# Patient Record
Sex: Female | Born: 1990 | Race: Black or African American | Hispanic: No | Smoking: Former smoker
Health system: Southern US, Community
[De-identification: ages and names within clinical notes are randomized; demographics above are authoritative.]

## PROBLEM LIST (undated history)

## (undated) ENCOUNTER — Inpatient Hospital Stay (HOSPITAL_COMMUNITY): Payer: Self-pay

## (undated) DIAGNOSIS — L0291 Cutaneous abscess, unspecified: Secondary | ICD-10-CM

---
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
## 2008-12-02 ENCOUNTER — Emergency Department (HOSPITAL_COMMUNITY): Admission: EM | Admit: 2008-12-02 | Discharge: 2008-12-02 | Payer: Self-pay | Admitting: Emergency Medicine

## 2009-05-25 ENCOUNTER — Ambulatory Visit (HOSPITAL_COMMUNITY): Admission: RE | Admit: 2009-05-25 | Discharge: 2009-05-25 | Payer: Self-pay | Admitting: Obstetrics

## 2009-09-03 ENCOUNTER — Inpatient Hospital Stay (HOSPITAL_COMMUNITY): Admission: AD | Admit: 2009-09-03 | Discharge: 2009-09-06 | Payer: Self-pay | Admitting: Obstetrics

## 2009-11-29 ENCOUNTER — Emergency Department (HOSPITAL_COMMUNITY): Admission: EM | Admit: 2009-11-29 | Discharge: 2009-11-29 | Payer: Self-pay | Admitting: Family Medicine

## 2010-06-08 LAB — WET PREP, GENITAL
Clue Cells Wet Prep HPF POC: NONE SEEN
Trich, Wet Prep: NONE SEEN
Yeast Wet Prep HPF POC: NONE SEEN

## 2010-06-08 LAB — POCT URINALYSIS DIPSTICK
Nitrite: NEGATIVE
Specific Gravity, Urine: 1.02 (ref 1.005–1.030)
Urobilinogen, UA: 4 mg/dL — ABNORMAL HIGH (ref 0.0–1.0)
pH: 7 (ref 5.0–8.0)

## 2010-06-08 LAB — GC/CHLAMYDIA PROBE AMP, GENITAL: GC Probe Amp, Genital: NEGATIVE

## 2010-06-12 LAB — CBC
HCT: 27.7 % — ABNORMAL LOW (ref 36.0–46.0)
HCT: 31.3 % — ABNORMAL LOW (ref 36.0–46.0)
Hemoglobin: 10.6 g/dL — ABNORMAL LOW (ref 12.0–15.0)
MCHC: 33.7 g/dL (ref 30.0–36.0)
MCHC: 34.1 g/dL (ref 30.0–36.0)
MCV: 89.7 fL (ref 78.0–100.0)
Platelets: 124 10*3/uL — ABNORMAL LOW (ref 150–400)
Platelets: 155 10*3/uL (ref 150–400)
RBC: 3.49 MIL/uL — ABNORMAL LOW (ref 3.87–5.11)

## 2010-06-30 LAB — CULTURE, ROUTINE-ABSCESS

## 2010-07-15 ENCOUNTER — Inpatient Hospital Stay (INDEPENDENT_AMBULATORY_CARE_PROVIDER_SITE_OTHER)
Admission: RE | Admit: 2010-07-15 | Discharge: 2010-07-15 | Disposition: A | Discharge status: Home / Self Care | Payer: Self-pay | Source: Ambulatory Visit | Attending: Family Medicine | Admitting: Family Medicine

## 2010-07-15 DIAGNOSIS — N760 Acute vaginitis: Secondary | ICD-10-CM

## 2010-07-15 LAB — POCT URINALYSIS DIP (DEVICE)
Ketones, ur: NEGATIVE mg/dL
Nitrite: NEGATIVE
Urobilinogen, UA: 1 mg/dL (ref 0.0–1.0)
pH: 6.5 (ref 5.0–8.0)

## 2010-08-22 ENCOUNTER — Emergency Department (HOSPITAL_COMMUNITY)
Admission: EM | Admit: 2010-08-22 | Discharge: 2010-08-22 | Disposition: A | Discharge status: Home / Self Care | Payer: Medicaid Other | Attending: Emergency Medicine | Admitting: Emergency Medicine

## 2010-08-22 DIAGNOSIS — B86 Scabies: Secondary | ICD-10-CM | POA: Insufficient documentation

## 2010-08-22 DIAGNOSIS — R21 Rash and other nonspecific skin eruption: Secondary | ICD-10-CM | POA: Insufficient documentation

## 2010-08-22 DIAGNOSIS — L2989 Other pruritus: Secondary | ICD-10-CM | POA: Insufficient documentation

## 2010-08-22 DIAGNOSIS — L298 Other pruritus: Secondary | ICD-10-CM | POA: Insufficient documentation

## 2010-11-08 ENCOUNTER — Emergency Department (HOSPITAL_COMMUNITY)
Admission: EM | Admit: 2010-11-08 | Discharge: 2010-11-08 | Disposition: A | Discharge status: Home / Self Care | Payer: Medicaid Other | Attending: Emergency Medicine | Admitting: Emergency Medicine

## 2010-11-08 DIAGNOSIS — F17200 Nicotine dependence, unspecified, uncomplicated: Secondary | ICD-10-CM | POA: Insufficient documentation

## 2010-11-08 DIAGNOSIS — R5383 Other fatigue: Secondary | ICD-10-CM | POA: Insufficient documentation

## 2010-11-08 DIAGNOSIS — R071 Chest pain on breathing: Secondary | ICD-10-CM | POA: Insufficient documentation

## 2010-11-08 DIAGNOSIS — R0602 Shortness of breath: Secondary | ICD-10-CM | POA: Insufficient documentation

## 2010-11-08 DIAGNOSIS — R5381 Other malaise: Secondary | ICD-10-CM | POA: Insufficient documentation

## 2010-11-08 LAB — COMPREHENSIVE METABOLIC PANEL
ALT: 7 U/L (ref 0–35)
Alkaline Phosphatase: 48 U/L (ref 39–117)
BUN: 6 mg/dL (ref 6–23)
Chloride: 107 mEq/L (ref 96–112)
GFR calc Af Amer: >60 mL/min (ref >60)
Glucose, Bld: 107 mg/dL — ABNORMAL HIGH (ref 70–99)
Potassium: 3.5 mEq/L (ref 3.5–5.1)
Sodium: 139 mEq/L (ref 135–145)

## 2010-11-08 LAB — CBC
HCT: 36 % (ref 36.0–46.0)
Hemoglobin: 12.1 g/dL (ref 12.0–15.0)
MCV: 87.2 fL (ref 78.0–100.0)
RBC: 4.13 MIL/uL (ref 3.87–5.11)
WBC: 6 10*3/uL (ref 4.0–10.5)

## 2010-11-08 LAB — URINALYSIS, ROUTINE W REFLEX MICROSCOPIC
Bilirubin Urine: NEGATIVE
Glucose, UA: NEGATIVE mg/dL
Hgb urine dipstick: NEGATIVE
Nitrite: NEGATIVE
pH: 6.5 (ref 5.0–8.0)

## 2010-11-08 LAB — DIFFERENTIAL
Basophils Absolute: 0 10*3/uL (ref 0.0–0.1)
Basophils Relative: 0 % (ref 0–1)
Eosinophils Relative: 1 % (ref 0–5)
Lymphocytes Relative: 59 % — ABNORMAL HIGH (ref 12–46)
Monocytes Absolute: 0.5 10*3/uL (ref 0.1–1.0)

## 2010-11-08 LAB — POCT PREGNANCY, URINE: Preg Test, Ur: NEGATIVE

## 2010-12-18 LAB — WOUND CULTURE: Culture: NO GROWTH

## 2010-12-21 LAB — URINALYSIS, ROUTINE W REFLEX MICROSCOPIC
Glucose, UA: NEGATIVE
Specific Gravity, Urine: 1.01
Urobilinogen, UA: 0.2
pH: 7

## 2010-12-21 LAB — CBC
MCHC: 33.8
RBC: 3.37 — ABNORMAL LOW
RDW: 13.6
WBC: 11.7 — ABNORMAL HIGH

## 2010-12-22 LAB — CBC
MCHC: 33.9
MCV: 89.1
Platelets: 123 — ABNORMAL LOW
RBC: 2.95 — ABNORMAL LOW
RDW: 12.9

## 2011-04-03 ENCOUNTER — Encounter: Payer: Self-pay | Admitting: Emergency Medicine

## 2011-04-03 ENCOUNTER — Emergency Department (HOSPITAL_COMMUNITY)
Admission: EM | Admit: 2011-04-03 | Discharge: 2011-04-04 | Disposition: A | Discharge status: Home / Self Care | Payer: Medicaid Other | Attending: Emergency Medicine | Admitting: Emergency Medicine

## 2011-04-03 DIAGNOSIS — F17200 Nicotine dependence, unspecified, uncomplicated: Secondary | ICD-10-CM | POA: Insufficient documentation

## 2011-04-03 DIAGNOSIS — L02419 Cutaneous abscess of limb, unspecified: Secondary | ICD-10-CM | POA: Insufficient documentation

## 2011-04-03 DIAGNOSIS — M79609 Pain in unspecified limb: Secondary | ICD-10-CM | POA: Insufficient documentation

## 2011-04-03 NOTE — ED Notes (Signed)
PT. REPORTS PROGRESSING ABSCESS AT RIGHT BUTTOCKS WITH NO DRAINAGE FOR 3 DAYS .

## 2011-04-04 MED ORDER — LIDOCAINE-EPINEPHRINE 1 %-1:100000 IJ SOLN
INTRAMUSCULAR | Status: AC
  Filled 2011-04-04: qty 1

## 2011-04-04 MED ORDER — DOXYCYCLINE HYCLATE 100 MG PO CAPS: 100.0000 mg | ORAL_CAPSULE | Freq: Two times a day (BID) | ORAL | Status: AC

## 2011-04-04 MED ORDER — OXYCODONE-ACETAMINOPHEN 5-325 MG PO TABS
ORAL_TABLET | ORAL | Status: AC
  Filled 2011-04-04: qty 1

## 2011-04-04 NOTE — ED Provider Notes (Signed)
History     CSN: [id number]  Arrival date & time 04/03/11  2021   First MD Initiated Contact with Patient 04/03/11 2356      Chief Complaint  Patient presents with  . Abscess    (Consider location/radiation/quality/duration/timing/severity/associated sxs/prior treatment) Patient is a 21 y.o. female presenting with abscess.  Abscess    the patient reports development of abscess to her bilateral thighs developing over the past several days.  Her right is.  In her left.  She denies fevers or chills.  She denies spreading erythema.  She reports a prior history of abscesses.  She has no inciting injury.  Her symptoms are worsened by movement and palpation and sitting.  Her symptoms are improved by nothing.  Her symptoms are constant.  Her pain is moderate in severity  History reviewed. No pertinent past medical history.  History reviewed. No pertinent past surgical history.  No family history on file.  History  Substance Use Topics  . Smoking status: Current Everyday Smoker  . Smokeless tobacco: Not on file  . Alcohol Use: Yes    OB History    Grav Para Term Preterm Abortions TAB SAB Ect Mult Living                  Review of Systems  All other systems reviewed and are negative.    Allergies  Latex  Home Medications   Current Outpatient Rx  Name Route Sig Dispense Refill  . DOXYCYCLINE HYCLATE 100 MG PO CAPS Oral Take 1 capsule (100 mg total) by mouth 2 (two) times daily. 14 capsule 0    BP 110/61  Pulse 76  Temp(Src) 98.3 F (36.8 C) (Oral)  Resp 16  SpO2 100%  Physical Exam  Nursing note and vitals reviewed. Constitutional: She is oriented to person, place, and time. She appears well-developed and well-nourished. No distress.  HENT:  Head: Normocephalic and atraumatic.  Eyes: EOM are normal.  Neck: Normal range of motion.  Cardiovascular: Normal rate, regular rhythm and normal heart sounds.   Pulmonary/Chest: Effort normal and breath sounds normal.    Abdominal: Soft. She exhibits no distension. There is no tenderness.  Musculoskeletal: Normal range of motion.       Right thigh with approximately 3 cm abscess of her proximal lateral thigh.  There is fluctuance without surrounding erythema or evidence of cellulitis.  No drainage is present.  She also has a small approximately 1 cm abscess to her left proximal lateral thigh without secondary signs of infection  Neurological: She is alert and oriented to person, place, and time.  Skin: Skin is warm and dry.  Psychiatric: She has a normal mood and affect. Judgment normal.    ED Course  Procedures (including critical care time)  INCISION AND DRAINAGE Performed by: Lyanne Co Consent: Verbal consent obtained. Risks and benefits: risks, benefits and alternatives were discussed Time out performed prior to procedure Type: abscess Body area: Right lateral thigh Anesthesia: local infiltration Local anesthetic: lidocaine 2 % with epinephrine Anesthetic total: 4 ml Complexity: complex Blunt dissection to break up loculations Drainage: purulent Drainage amount: Moderate  Packing material: 1/4 in iodoform gauze Patient tolerance: Patient tolerated the procedure well with no immediate complications.  INCISION AND DRAINAGE Performed by: Lyanne Co Consent: Verbal consent obtained. Risks and benefits: risks, benefits and alternatives were discussed Time out performed prior to procedure Type: abscess Body area: Left lateral thigh Anesthesia: None  Local anesthetic: None  Anesthetic total: 0 ml Complexity: Simple  Drainage: purulent drainage amount: Small  Packing material: None  Patient tolerance: Patient tolerated the procedure well with no immediate complications.      Labs Reviewed - No data to display No results found.   1. Abscess of thigh       MDM  I and D  Of right thigh and left thigh abscess. Home with doxy. Infection warnings given        Lyanne Co, MD 04/04/11 [phone]

## 2011-04-04 NOTE — ED Notes (Signed)
Prescription for doxycycline 100mg  po bid, dispense 14, no refills called in to [facility] on [address] at [phone] as per order by [employer], md.

## 2011-04-04 NOTE — ED Notes (Signed)
Please see paper charting. 

## 2011-12-04 ENCOUNTER — Emergency Department (INDEPENDENT_AMBULATORY_CARE_PROVIDER_SITE_OTHER)
Admission: EM | Admit: 2011-12-04 | Discharge: 2011-12-04 | Disposition: A | Discharge status: Home / Self Care | Payer: Medicaid Other | Source: Home / Self Care | Attending: Family Medicine | Admitting: Family Medicine

## 2011-12-04 ENCOUNTER — Encounter (HOSPITAL_COMMUNITY): Payer: Self-pay | Admitting: Emergency Medicine

## 2011-12-04 DIAGNOSIS — N760 Acute vaginitis: Secondary | ICD-10-CM

## 2011-12-04 LAB — WET PREP, GENITAL: Trich, Wet Prep: NONE SEEN

## 2011-12-04 LAB — POCT PREGNANCY, URINE: Preg Test, Ur: NEGATIVE

## 2011-12-04 LAB — POCT URINALYSIS DIP (DEVICE)
Glucose, UA: NEGATIVE mg/dL
Nitrite: NEGATIVE
Protein, ur: 30 mg/dL — AB
Specific Gravity, Urine: 1.025 (ref 1.005–1.030)
Urobilinogen, UA: >=8 mg/dL (ref 0.0–1.0)

## 2011-12-04 MED ORDER — METRONIDAZOLE 500 MG PO TABS: 500.0000 mg | ORAL_TABLET | Freq: Two times a day (BID) | ORAL | Status: AC

## 2011-12-04 MED ORDER — FLUCONAZOLE 150 MG PO TABS: ORAL_TABLET | ORAL | Status: DC

## 2011-12-04 NOTE — ED Provider Notes (Signed)
History     CSN: [id number]  Arrival date & time 12/04/11  Ernestina Columbia   First MD Initiated Contact with Patient 12/04/11 1941      Chief Complaint  Patient presents with  . Vaginal Itching    (Consider location/radiation/quality/duration/timing/severity/associated sxs/prior treatment) HPI Comments: 21 year old female, smoker G4 P4 004 currently on Implanon for birth control. Here complaining of vaginal itchiness and burning sensation for a few days but worse in the last 2 days. Having brown spotting, does not have normal periods (other than scant spotting some months) after Implanon. Denies pelvic or abdominal pain. Reports unprotected sex months ago. Wants to be checked for sexual transmitted diseases. Denies burning on urination, hematuria or frequency.   History reviewed. No pertinent past medical history.  History reviewed. No pertinent past surgical history.  History reviewed. No pertinent family history.  History  Substance Use Topics  . Smoking status: Current Everyday Smoker  . Smokeless tobacco: Not on file  . Alcohol Use: Yes    OB History    Grav Para Term Preterm Abortions TAB SAB Ect Mult Living                  Review of Systems  Constitutional: Negative for fever and chills.       10 systems reviewed and  pertinent negative and positive symptoms are as per HPI.     Genitourinary: Positive for vaginal discharge. Negative for dysuria, urgency, frequency, hematuria and flank pain.  Neurological: Negative for dizziness and headaches.  All other systems reviewed and are negative.    Allergies  Latex  Home Medications   Current Outpatient Rx  Name Route Sig Dispense Refill  . FLUCONAZOLE 150 MG PO TABS  1 tab po q72 h for 2 doses 2 tablet 0  . METRONIDAZOLE 500 MG PO TABS Oral Take 1 tablet (500 mg total) by mouth 2 (two) times daily. 14 tablet 0    BP 117/65  Pulse 75  Temp 98.9 F (37.2 C) (Oral)  Resp 17  SpO2 100%  Physical Exam  Nursing note  and vitals reviewed. Constitutional: She is oriented to person, place, and time. She appears well-developed and well-nourished. No distress.  HENT:  Head: Normocephalic and atraumatic.  Mouth/Throat: No oropharyngeal exudate.  Eyes: Conjunctivae normal are normal. No scleral icterus.  Neck: Neck supple.  Cardiovascular: Normal heart sounds.   Pulmonary/Chest: Breath sounds normal.  Abdominal: Soft. She exhibits no distension and no mass. There is no tenderness. There is no rebound and no guarding.  Genitourinary: There is no lesion on the right labia. There is no lesion on the left labia. Uterus is not enlarged and not tender. Cervix exhibits no motion tenderness and no friability. Right adnexum displays no mass, no tenderness and no fullness. Left adnexum displays no mass, no tenderness and no fullness. Vaginal discharge found.  Lymphadenopathy:    She has no cervical adenopathy.       Right: No inguinal adenopathy present.       Left: No inguinal adenopathy present.  Neurological: She is alert and oriented to person, place, and time.  Skin: No rash noted.    ED Course  Procedures (including critical care time)  Labs Reviewed  POCT URINALYSIS DIP (DEVICE) - Abnormal; Notable for the following:    Bilirubin Urine SMALL (*)     Hgb urine dipstick LARGE (*)     Protein, ur 30 (*)     All other components within normal limits  POCT  PREGNANCY, URINE  GC/CHLAMYDIA PROBE AMP, GENITAL  WET PREP, GENITAL   No results found.   1. Vaginitis and vulvovaginitis       MDM  Treated empirically with Flagyl and Diflucan. Wet prep, GC and Chlamydia pending at the time of discharge. Supportive/preventive care instructions discussed with patient and provided in writing.        Sharin Grave, MD 12/05/11 1053

## 2011-12-04 NOTE — ED Notes (Signed)
Pt c/o vaginal itching for 2 days that is especially when peeing or cleaning. Pt denies any pain, unusual discharge, frequency, dysuria, or odor. Pt denies any recent unprotected sex.

## 2011-12-06 LAB — GC/CHLAMYDIA PROBE AMP, GENITAL: GC Probe Amp, Genital: POSITIVE — AB

## 2011-12-07 ENCOUNTER — Telehealth (HOSPITAL_COMMUNITY): Payer: Self-pay | Admitting: *Deleted

## 2011-12-07 NOTE — ED Notes (Signed)
GC pos., Chlamydia neg., Wet prep: many clue cells. Pt. adequately treated with Flagyl. Pt. needs tx. for GC. I called and left a message to call. Vassie Moselle 12/07/2011

## 2011-12-10 ENCOUNTER — Telehealth (HOSPITAL_COMMUNITY): Payer: Self-pay | Admitting: *Deleted

## 2011-12-10 NOTE — ED Notes (Signed)
Pt. called back on VM today. I called her back.  Pt. verified x 2 and given results.  Pt. told she was adequately treated with Flagyl for bacterial vaginosis.  Pt. told she needs to come back for treatment of GC. Pt. will come today.  Pt. instructed to notify her partner, no sex for 1 week and to practice safe sex. Pt. told she can get HIV testing at the [facility]. STD clinic, by appointment. Vassie Moselle 12/10/2011

## 2011-12-11 ENCOUNTER — Telehealth (HOSPITAL_COMMUNITY): Payer: Self-pay | Admitting: *Deleted

## 2011-12-11 NOTE — ED Notes (Signed)
Pt. sas not come in for her treatment yet. I called her and she said she did not have a ride yesterday and she is not financially stable enough to get gas to get here. She said she will try to come tomorrow around 1230. Vassie Moselle 12/11/2011

## 2011-12-12 ENCOUNTER — Telehealth (HOSPITAL_COMMUNITY): Payer: Self-pay | Admitting: *Deleted

## 2011-12-12 NOTE — ED Notes (Signed)
Pt. did not come @ 1230 for treatment.  [facility] form faxed to the [facility] as untreated. Will try calling pt. again before 1800. Vassie Moselle 12/12/2011

## 2012-01-30 ENCOUNTER — Telehealth (HOSPITAL_COMMUNITY): Payer: Self-pay | Admitting: *Deleted

## 2012-01-30 NOTE — ED Notes (Signed)
Julia Russell from the [facility] called and said they have closed out her chart. She called her and pt. said she was treated. When she asked where she was treated, the pt. became hostile on the phone.  They do not send caseworkers to the house in that situation.  I said we have no record that she was treated. Vassie Moselle 01/30/2012

## 2013-04-11 ENCOUNTER — Emergency Department (HOSPITAL_COMMUNITY): Payer: Medicaid Other

## 2013-04-11 ENCOUNTER — Emergency Department (HOSPITAL_COMMUNITY)
Admission: EM | Admit: 2013-04-11 | Discharge: 2013-04-11 | Disposition: A | Discharge status: Home / Self Care | Payer: Medicaid Other | Attending: Emergency Medicine | Admitting: Emergency Medicine

## 2013-04-11 ENCOUNTER — Encounter (HOSPITAL_COMMUNITY): Payer: Self-pay | Admitting: Emergency Medicine

## 2013-04-11 DIAGNOSIS — R1013 Epigastric pain: Secondary | ICD-10-CM | POA: Insufficient documentation

## 2013-04-11 DIAGNOSIS — R1011 Right upper quadrant pain: Secondary | ICD-10-CM | POA: Insufficient documentation

## 2013-04-11 DIAGNOSIS — R109 Unspecified abdominal pain: Secondary | ICD-10-CM

## 2013-04-11 DIAGNOSIS — F17200 Nicotine dependence, unspecified, uncomplicated: Secondary | ICD-10-CM | POA: Insufficient documentation

## 2013-04-11 DIAGNOSIS — Z79899 Other long term (current) drug therapy: Secondary | ICD-10-CM | POA: Insufficient documentation

## 2013-04-11 DIAGNOSIS — R059 Cough, unspecified: Secondary | ICD-10-CM | POA: Insufficient documentation

## 2013-04-11 DIAGNOSIS — K5900 Constipation, unspecified: Secondary | ICD-10-CM | POA: Insufficient documentation

## 2013-04-11 DIAGNOSIS — Z91040 Latex allergy status: Secondary | ICD-10-CM | POA: Insufficient documentation

## 2013-04-11 DIAGNOSIS — R112 Nausea with vomiting, unspecified: Secondary | ICD-10-CM | POA: Insufficient documentation

## 2013-04-11 DIAGNOSIS — R05 Cough: Secondary | ICD-10-CM | POA: Insufficient documentation

## 2013-04-11 DIAGNOSIS — R51 Headache: Secondary | ICD-10-CM | POA: Insufficient documentation

## 2013-04-11 DIAGNOSIS — Z3202 Encounter for pregnancy test, result negative: Secondary | ICD-10-CM | POA: Insufficient documentation

## 2013-04-11 LAB — PREGNANCY, URINE: PREG TEST UR: NEGATIVE

## 2013-04-11 LAB — URINALYSIS, ROUTINE W REFLEX MICROSCOPIC
Bilirubin Urine: NEGATIVE
GLUCOSE, UA: NEGATIVE mg/dL
HGB URINE DIPSTICK: NEGATIVE
Ketones, ur: NEGATIVE mg/dL
LEUKOCYTES UA: NEGATIVE
Nitrite: NEGATIVE
PH: 6.5 (ref 5.0–8.0)
Protein, ur: 30 mg/dL — AB
SPECIFIC GRAVITY, URINE: 1.034 — AB (ref 1.005–1.030)
Urobilinogen, UA: 1 mg/dL (ref 0.0–1.0)

## 2013-04-11 LAB — CBC WITH DIFFERENTIAL/PLATELET
BASOS ABS: 0 10*3/uL (ref 0.0–0.1)
BASOS PCT: 0 % (ref 0–1)
EOS ABS: 0 10*3/uL (ref 0.0–0.7)
EOS PCT: 1 % (ref 0–5)
HEMATOCRIT: 42.4 % (ref 36.0–46.0)
Hemoglobin: 14.2 g/dL (ref 12.0–15.0)
Lymphocytes Relative: 53 % — ABNORMAL HIGH (ref 12–46)
Lymphs Abs: 1.7 10*3/uL (ref 0.7–4.0)
MCH: 29.7 pg (ref 26.0–34.0)
MCHC: 33.5 g/dL (ref 30.0–36.0)
MCV: 88.7 fL (ref 78.0–100.0)
MONO ABS: 0.9 10*3/uL (ref 0.1–1.0)
Monocytes Relative: 27 % — ABNORMAL HIGH (ref 3–12)
NEUTROS ABS: 0.6 10*3/uL — AB (ref 1.7–7.7)
Neutrophils Relative %: 18 % — ABNORMAL LOW (ref 43–77)
Platelets: 151 10*3/uL (ref 150–400)
RBC: 4.78 MIL/uL (ref 3.87–5.11)
RDW: 12.9 % (ref 11.5–15.5)
WBC: 3.2 10*3/uL — ABNORMAL LOW (ref 4.0–10.5)

## 2013-04-11 LAB — COMPREHENSIVE METABOLIC PANEL
ALBUMIN: 3.7 g/dL (ref 3.5–5.2)
ALT: 12 U/L (ref 0–35)
AST: 19 U/L (ref 0–37)
Alkaline Phosphatase: 43 U/L (ref 39–117)
BUN: 8 mg/dL (ref 6–23)
CALCIUM: 8.8 mg/dL (ref 8.4–10.5)
CHLORIDE: 104 meq/L (ref 96–112)
CO2: 22 mEq/L (ref 19–32)
CREATININE: 0.81 mg/dL (ref 0.50–1.10)
GFR calc Af Amer: >90 mL/min (ref >90)
GFR calc non Af Amer: >90 mL/min (ref >90)
Glucose, Bld: 97 mg/dL (ref 70–99)
Potassium: 4.4 mEq/L (ref 3.7–5.3)
Sodium: 139 mEq/L (ref 137–147)
TOTAL PROTEIN: 7.3 g/dL (ref 6.0–8.3)
Total Bilirubin: <0.2 mg/dL — ABNORMAL LOW (ref 0.3–1.2)

## 2013-04-11 LAB — URINE MICROSCOPIC-ADD ON

## 2013-04-11 LAB — LIPASE, BLOOD: LIPASE: 33 U/L (ref 11–59)

## 2013-04-11 MED ORDER — POLYETHYLENE GLYCOL 3350 17 G PO PACK: 17.0000 g | PACK | Freq: Every day | ORAL | Status: DC

## 2013-04-11 MED ORDER — ONDANSETRON HCL 4 MG PO TABS: 4.0000 mg | ORAL_TABLET | Freq: Four times a day (QID) | ORAL | Status: DC

## 2013-04-11 MED ORDER — METOCLOPRAMIDE HCL 5 MG/ML IJ SOLN
10.0000 mg | Freq: Once | INTRAMUSCULAR | Status: AC
  Administered 2013-04-11: 10 mg via INTRAVENOUS
  Filled 2013-04-11: qty 2

## 2013-04-11 MED ORDER — SODIUM CHLORIDE 0.9 % IV BOLUS (SEPSIS)
1000.0000 mL | Freq: Once | INTRAVENOUS | Status: AC
  Administered 2013-04-11: 1000 mL via INTRAVENOUS

## 2013-04-11 NOTE — ED Notes (Signed)
Patient transported to X-ray 

## 2013-04-11 NOTE — ED Notes (Signed)
Patient returned from Ultrasound. 

## 2013-04-11 NOTE — ED Provider Notes (Signed)
CSN: [id number]     Arrival date & time 04/11/13  1146 History   First MD Initiated Contact with Patient 04/11/13 1233     Chief Complaint  Patient presents with  . Abdominal Pain  . Emesis   (Consider location/radiation/quality/duration/timing/severity/associated sxs/prior Treatment) Patient is a 23 y.o. female presenting with abdominal pain and vomiting. The history is provided by the patient. No language interpreter was used.  Abdominal Pain Associated symptoms: cough, nausea and vomiting   Associated symptoms: no chest pain, no chills, no constipation, no diarrhea, no fever, no shortness of breath, no vaginal bleeding and no vaginal discharge   Emesis Associated symptoms: abdominal pain and headaches   Associated symptoms: no chills and no diarrhea   Julia Russell is a 23 year old female with no significant past medical history presenting to emergency department with abdominal pain, nausea, vomiting that started approximately 2-3 days ago. Patient reports that the stomach pain is "like I'm gonna throw up" that is a constant discomfort. Patient reports that the discomfort is localized in the epigastric region without radiation. Patient reports that she's had at least 2 episodes of emesis yesterday and 4 episodes of emesis today-reported mainly of food and water contents-NB/NB. Patient reports that she's been using nothing for the discomfort. Reported that she's been having an ongoing headache, started approximately 3 days ago with gradual onset and has gotten progressively worse, gradually - denied thunderclap onset. Patient reported that she has not taken anything for the discomfort. Stated that she has been having a productive cough for the past couple of days with a thick white phlegm noted. Denied fever, nasal congestion, chest pain, shortness of breath, difficulty breathing, abdominal pain, neck pain, neck stiffness, blurred vision, vaginal pain, vaginal discharge.  PCP none   History  reviewed. No pertinent past medical history. History reviewed. No pertinent past surgical history. No family history on file. History  Substance Use Topics  . Smoking status: Current Every Day Smoker  . Smokeless tobacco: Not on file  . Alcohol Use: Yes   OB History   Grav Para Term Preterm Abortions TAB SAB Ect Mult Living                 Review of Systems  Constitutional: Negative for fever and chills.  Respiratory: Positive for cough. Negative for chest tightness and shortness of breath.   Cardiovascular: Negative for chest pain.  Gastrointestinal: Positive for nausea, vomiting and abdominal pain. Negative for diarrhea, constipation, blood in stool and anal bleeding.  Genitourinary: Negative for vaginal bleeding and vaginal discharge.  Musculoskeletal: Negative for back pain.  Neurological: Positive for headaches.  All other systems reviewed and are negative.    Allergies  Latex  Home Medications   Current Outpatient Rx  Name  Route  Sig  Dispense  Refill  . fluconazole (DIFLUCAN) 150 MG tablet      1 tab po q72 h for 2 doses   2 tablet   0   . ondansetron (ZOFRAN) 4 MG tablet   Oral   Take 1 tablet (4 mg total) by mouth every 6 (six) hours.   12 tablet   0   . polyethylene glycol (MIRALAX / GLYCOLAX) packet   Oral   Take 17 g by mouth daily.   14 each   0    BP 114/66  Pulse 69  Temp(Src) 98.9 F (37.2 C) (Oral)  Resp 16  Ht 5\' 5"  (1.651 m)  Wt 168 lb (76.204 kg)  BMI  27.96 kg/m2  SpO2 98% Physical Exam  Nursing note and vitals reviewed. Constitutional: She is oriented to person, place, and time. She appears well-developed and well-nourished. No distress.  HENT:  Head: Normocephalic and atraumatic.  Mouth/Throat: Oropharynx is clear and moist. No oropharyngeal exudate.  Eyes: Conjunctivae and EOM are normal. Pupils are equal, round, and reactive to light. Right eye exhibits no discharge. Left eye exhibits no discharge.  Neck: Neck supple.    Cardiovascular: Normal rate, regular rhythm and normal heart sounds.   Pulses:      Radial pulses are 2+ on the right side, and 2+ on the left side.  Pulmonary/Chest: Effort normal and breath sounds normal. No respiratory distress. She has no wheezes. She has no rales.  Abdominal: Soft. Normal appearance and bowel sounds are normal. There is tenderness in the right upper quadrant and epigastric area. There is no guarding.    Discomfort upon palpation to right upper quadrant and epigastric region Positive Murphy's sign. Negative upon palpation to bilateral lower quadrants-negative McBurney's point, negative Rovsing sign  Musculoskeletal: Normal range of motion.  Full ROM to upper and lower extremities without difficulty noted, negative ataxia noted.  Neurological: She is alert and oriented to person, place, and time. She exhibits normal muscle tone. Coordination normal.  Skin: Skin is warm and dry. No rash noted. She is not diaphoretic. No erythema.  Psychiatric: She has a normal mood and affect. Her behavior is normal. Thought content normal.    ED Course  Procedures (including critical care time)  4:34 PM This provider re-assessed patient. Reported that she no longer has a headache, stated that her headache has been relieved. Patient reported that she does not have nausea, reported that her abdominal pain has improved. Discussed labs and imaging in detail with patient - patient understood. PO fluid challenge to be performed.   4:49 PM This provider re-assessed the patient. Patient able to tolerate crackers and water PO. Negative episodes of emesis while in ED setting. Patient reported that she is ready to go home.   Results for orders placed during the hospital encounter of 04/11/13  CBC WITH DIFFERENTIAL      Result Value Range   WBC 3.2 (*) 4.0 - 10.5 K/uL   RBC 4.78  3.87 - 5.11 MIL/uL   Hemoglobin 14.2  12.0 - 15.0 g/dL   HCT 16.1  09.6 - 04.5 %   MCV 88.7  78.0 - 100.0 fL    MCH 29.7  26.0 - 34.0 pg   MCHC 33.5  30.0 - 36.0 g/dL   RDW 40.9  81.1 - 91.4 %   Platelets 151  150 - 400 K/uL   Neutrophils Relative % 18 (*) 43 - 77 %   Neutro Abs 0.6 (*) 1.7 - 7.7 K/uL   Lymphocytes Relative 53 (*) 12 - 46 %   Lymphs Abs 1.7  0.7 - 4.0 K/uL   Monocytes Relative 27 (*) 3 - 12 %   Monocytes Absolute 0.9  0.1 - 1.0 K/uL   Eosinophils Relative 1  0 - 5 %   Eosinophils Absolute 0.0  0.0 - 0.7 K/uL   Basophils Relative 0  0 - 1 %   Basophils Absolute 0.0  0.0 - 0.1 K/uL  COMPREHENSIVE METABOLIC PANEL      Result Value Range   Sodium 139  137 - 147 mEq/L   Potassium 4.4  3.7 - 5.3 mEq/L   Chloride 104  96 - 112 mEq/L   CO2  22  19 - 32 mEq/L   Glucose, Bld 97  70 - 99 mg/dL   BUN 8  6 - 23 mg/dL   Creatinine, Ser 1.61  0.50 - 1.10 mg/dL   Calcium 8.8  8.4 - 09.6 mg/dL   Total Protein 7.3  6.0 - 8.3 g/dL   Albumin 3.7  3.5 - 5.2 g/dL   AST 19  0 - 37 U/L   ALT 12  0 - 35 U/L   Alkaline Phosphatase 43  39 - 117 U/L   Total Bilirubin <0.2 (*) 0.3 - 1.2 mg/dL   GFR calc non Af Amer >90  >90 mL/min   GFR calc Af Amer >90  >90 mL/min  URINALYSIS, ROUTINE W REFLEX MICROSCOPIC      Result Value Range   Color, Urine YELLOW  YELLOW   APPearance CLEAR  CLEAR   Specific Gravity, Urine 1.034 (*) 1.005 - 1.030   pH 6.5  5.0 - 8.0   Glucose, UA NEGATIVE  NEGATIVE mg/dL   Hgb urine dipstick NEGATIVE  NEGATIVE   Bilirubin Urine NEGATIVE  NEGATIVE   Ketones, ur NEGATIVE  NEGATIVE mg/dL   Protein, ur 30 (*) NEGATIVE mg/dL   Urobilinogen, UA 1.0  0.0 - 1.0 mg/dL   Nitrite NEGATIVE  NEGATIVE   Leukocytes, UA NEGATIVE  NEGATIVE  PREGNANCY, URINE      Result Value Range   Preg Test, Ur NEGATIVE  NEGATIVE  LIPASE, BLOOD      Result Value Range   Lipase 33  11 - 59 U/L  URINE MICROSCOPIC-ADD ON      Result Value Range   Squamous Epithelial / LPF RARE  RARE   WBC, UA 0-2  <3 WBC/hpf   RBC / HPF 0-2  <3 RBC/hpf   Urine-Other MUCOUS PRESENT     Dg Chest 2  View  04/11/2013   CLINICAL DATA:  Cough and cold symptoms.  Emesis.  EXAM: CHEST  2 VIEW  COMPARISON:  None.  FINDINGS: The heart size and mediastinal contours are within normal limits. Both lungs are clear. The visualized skeletal structures are unremarkable.  IMPRESSION: Negative two view chest.   Electronically Signed   By: Gennette Pac M.D.   On: 04/11/2013 14:15   Dg Abd 1 View  04/11/2013   CLINICAL DATA:  Epigastric pain and vomiting.  EXAM: ABDOMEN - 1 VIEW  COMPARISON:  None.  FINDINGS: The bowel gas pattern is within the limits of normal. I cannot exclude an element of constipation but there is no evidence of a small or large bowel obstruction. No abnormal soft tissue calcifications are demonstrated. The bony structures appear normal.  IMPRESSION: There is no evidence of bowel obstruction or ileus. A moderate amount of stool in the ascending and transverse portions of the colon may be normal for the patient or could reflect mild constipation.   Electronically Signed   By: David  [place]   On: 04/11/2013 15:05   US Abdomen Limited Ruq  04/11/2013   CLINICAL DATA:  Right upper quadrant pain, possible gallbladder disease  EXAM: US ABDOMEN LIMITED - RIGHT UPPER QUADRANT  COMPARISON:  KUB film of today's date  FINDINGS: Gallbladder  The gallbladder is adequately distended with no evidence of stones, wall thickening, or pericholecystic fluid. There is no positive sonographic Murphy's sign.  Common bile duct  Diameter: 3.1 mm.  Liver:  The liver exhibits normal echotexture with no focal mass or ductal dilation. No ascites is demonstrated.  IMPRESSION:  Normal limited right upper quadrant ultrasound examination. There is no sonographic evidence of cholelithiasis or cholecystitis.   Electronically Signed   By: David  [place]   On: 04/11/2013 16:17    Labs Review Labs Reviewed  CBC WITH DIFFERENTIAL - Abnormal; Notable for the following:    WBC 3.2 (*)    Neutrophils Relative % 18 (*)    Neutro Abs  0.6 (*)    Lymphocytes Relative 53 (*)    Monocytes Relative 27 (*)    All other components within normal limits  COMPREHENSIVE METABOLIC PANEL - Abnormal; Notable for the following:    Total Bilirubin <0.2 (*)    All other components within normal limits  URINALYSIS, ROUTINE W REFLEX MICROSCOPIC - Abnormal; Notable for the following:    Specific Gravity, Urine 1.034 (*)    Protein, ur 30 (*)    All other components within normal limits  PREGNANCY, URINE  LIPASE, BLOOD  URINE MICROSCOPIC-ADD ON   Imaging Review Dg Chest 2 View  04/11/2013   CLINICAL DATA:  Cough and cold symptoms.  Emesis.  EXAM: CHEST  2 VIEW  COMPARISON:  None.  FINDINGS: The heart size and mediastinal contours are within normal limits. Both lungs are clear. The visualized skeletal structures are unremarkable.  IMPRESSION: Negative two view chest.   Electronically Signed   By: Gennette Pachris  Mattern M.D.   On: 04/11/2013 14:15   Dg Abd 1 View  04/11/2013   CLINICAL DATA:  Epigastric pain and vomiting.  EXAM: ABDOMEN - 1 VIEW  COMPARISON:  None.  FINDINGS: The bowel gas pattern is within the limits of normal. I cannot exclude an element of constipation but there is no evidence of a small or large bowel obstruction. No abnormal soft tissue calcifications are demonstrated. The bony structures appear normal.  IMPRESSION: There is no evidence of bowel obstruction or ileus. A moderate amount of stool in the ascending and transverse portions of the colon may be normal for the patient or could reflect mild constipation.   Electronically Signed   By: David  [place]   On: 04/11/2013 15:05   [place] Abdomen Limited Ruq  04/11/2013   CLINICAL DATA:  Right upper quadrant pain, possible gallbladder disease  EXAM: US ABDOMEN LIMITED - RIGHT UPPER QUADRANT  COMPARISON:  KUB film of today's date  FINDINGS: Gallbladder  The gallbladder is adequately distended with no evidence of stones, wall thickening, or pericholecystic fluid. There is no positive  sonographic Murphy's sign.  Common bile duct  Diameter: 3.1 mm.  Liver:  The liver exhibits normal echotexture with no focal mass or ductal dilation. No ascites is demonstrated.  IMPRESSION: Normal limited right upper quadrant ultrasound examination. There is no sonographic evidence of cholelithiasis or cholecystitis.   Electronically Signed   By: David  [place]   On: 04/11/2013 16:17    EKG Interpretation   None       MDM   1. Abdominal pain   2. Nausea and vomiting   3. Constipation     Medications  sodium chloride 0.9 % bolus 1,000 mL (0 mLs Intravenous Stopped 04/11/13 1445)  metoCLOPramide (REGLAN) injection 10 mg (10 mg Intravenous Given 04/11/13 1527)   Filed Vitals:   04/11/13 1300 04/11/13 1406 04/11/13 1504 04/11/13 1531  BP: 103/63 92/56 114/64 114/66  Pulse: 68 60 59 69  Temp:      TempSrc:      Resp:  16 22 16   Height:      Weight:  SpO2: 99% 98% 100% 98%    Patient presenting to the ED with epigastric and RUQ pain that started 2-3 days ago. Patient reported that it is a constant sharp shooting pain without radiation. Patient reported associated symptoms of nausea and vomiting - reported approximately 2 episodes yesterday and 4 episodes today - NB/NB. Stated that she has also developed a headache over the past couple of days. Stated that the headache is an aching sensation that has gradual gotten worse. Reported cough that has been productive of a thick white phlegm for the past couple of days.  Alert and oriented. GCS 15. Heart rate and rhythm normal. Lungs clear to auscultation to upper and lower lobes bilaterally. Radial pulses 2+ bilaterally. BS normoactive in all 4 quadrants. Discomfort upon palpation to the epigastric region. Positive Murphy's sign. PERRLA. Negative pain upon palpation to the maxillary sinus - discomfort upon palpation to the frontal aspect of the forehead.  CBC negative elevated WBC noted. CMP negative findings noted. UA negative findings for  infection noted. Urine pregnancy negative. Lipase negative elevation. Chest xray negative findings for acute abnormalities. Abdomen plain film noted mild constipation. [place] of RUQ negative findings for cholecystitis and cholelithiasis.  Doubt pancreatitis. Doubt ileus. Doubt pneumonia. Doubt SBO. Doubt acute abdominal process - doubt cholecystitis or cholelithiasis. Suspicion to be constipation, viral illness of URI. Etiology of abdominal pain is unknown. Doubt ICH. Doubt SAH. Patient's pain controlled in ED setting. Headache, nausea, and abdominal pain relieved. Patient tolerated PO fluid challenge and crackers - negative episodes of emesis while in ED setting. Patient stable, afebrile. Discharged patient. Referred patient to PCP and GI. Discharged patient with antiemetics. Discussed with patient diet. Discussed with patient to rest and stay hydrated. Discussed with patient to closely monitor symptoms and if symptoms are to worsen or change to report back to the ED - strict return instructions given.  Patient agreed to plan of care, understood, all questions answered.   Raymon Mutton, PA-C 04/11/13 2208

## 2013-04-11 NOTE — ED Notes (Signed)
Pt c/o intermittent abdominal pain with n/v onset last night. Pt reports last BM was 2-3 days ago. Pt reports that her children have been sick recently.

## 2013-04-11 NOTE — Discharge Instructions (Signed)
Please call your doctor for a followup appointment within 24-48 hours. When you talk to your doctor please let them know that you were seen in the emergency department and have them acquire all of your records so that they can discuss the findings with you and formulate a treatment plan to fully care for your new and ongoing problems. Please call and set-up an appointment to be seen next week by your primary care provider. Please call and set-up an appointment with Gastroenterology, stomach doctor, to be re-assessed Please rest and stay hydrated Please avoid fatty, greasy, oily foods Please stick with a clear diet for the next couple of days Please take medications as needed Please continue to monitor symptoms and if symptoms are to worsen or change (fever greater than 101, chills, sweating, nausea, vomiting, diarrhea, stomach pain worsens, inability to keep food or fluids down, black tarry stools, bright red bloody stools, no bowel movement within the next couple of days, decreased passing of gas) please report back to the ED immediately   Abdominal Pain, Women Abdominal (stomach, pelvic, or belly) pain can be caused by many things. It is important to tell your doctor:  The location of the pain.  Does it come and go or is it present all the time?  Are there things that start the pain (eating certain foods, exercise)?  Are there other symptoms associated with the pain (fever, nausea, vomiting, diarrhea)? All of this is helpful to know when trying to find the cause of the pain. CAUSES   Stomach: virus or bacteria infection, or ulcer.  Intestine: appendicitis (inflamed appendix), regional ileitis (Crohn's disease), ulcerative colitis (inflamed colon), irritable bowel syndrome, diverticulitis (inflamed diverticulum of the colon), or cancer of the stomach or intestine.  Gallbladder disease or stones in the gallbladder.  Kidney disease, kidney stones, or infection.  Pancreas infection or  cancer.  Fibromyalgia (pain disorder).  Diseases of the female organs:  Uterus: fibroid (non-cancerous) tumors or infection.  Fallopian tubes: infection or tubal pregnancy.  Ovary: cysts or tumors.  Pelvic adhesions (scar tissue).  Endometriosis (uterus lining tissue growing in the pelvis and on the pelvic organs).  Pelvic congestion syndrome (female organs filling up with blood just before the menstrual period).  Pain with the menstrual period.  Pain with ovulation (producing an egg).  Pain with an IUD (intrauterine device, birth control) in the uterus.  Cancer of the female organs.  Functional pain (pain not caused by a disease, may improve without treatment).  Psychological pain.  Depression. DIAGNOSIS  Your doctor will decide the seriousness of your pain by doing an examination.  Blood tests.  X-rays.  Ultrasound.  CT scan (computed tomography, special type of X-ray).  MRI (magnetic resonance imaging).  Cultures, for infection.  Barium enema (dye inserted in the large intestine, to better view it with X-rays).  Colonoscopy (looking in intestine with a lighted tube).  Laparoscopy (minor surgery, looking in abdomen with a lighted tube).  Major abdominal exploratory surgery (looking in abdomen with a large incision). TREATMENT  The treatment will depend on the cause of the pain.   Many cases can be observed and treated at home.  Over-the-counter medicines recommended by your caregiver.  Prescription medicine.  Antibiotics, for infection.  Birth control pills, for painful periods or for ovulation pain.  Hormone treatment, for endometriosis.  Nerve blocking injections.  Physical therapy.  Antidepressants.  Counseling with a psychologist or psychiatrist.  Minor or major surgery. HOME CARE INSTRUCTIONS   Do not  take laxatives, unless directed by your caregiver.  Take over-the-counter pain medicine only if ordered by your caregiver. Do not  take aspirin because it can cause an upset stomach or bleeding.  Try a clear liquid diet (broth or water) as ordered by your caregiver. Slowly move to a bland diet, as tolerated, if the pain is related to the stomach or intestine.  Have a thermometer and take your temperature several times a day, and record it.  Bed rest and sleep, if it helps the pain.  Avoid sexual intercourse, if it causes pain.  Avoid stressful situations.  Keep your follow-up appointments and tests, as your caregiver orders.  If the pain does not go away with medicine or surgery, you may try:  Acupuncture.  Relaxation exercises (yoga, meditation).  Group therapy.  Counseling. SEEK MEDICAL CARE IF:   You notice certain foods cause stomach pain.  Your home care treatment is not helping your pain.  You need stronger pain medicine.  You want your IUD removed.  You feel faint or lightheaded.  You develop nausea and vomiting.  You develop a rash.  You are having side effects or an allergy to your medicine. SEEK IMMEDIATE MEDICAL CARE IF:   Your pain does not go away or gets worse.  You have a fever.  Your pain is felt only in portions of the abdomen. The right side could possibly be appendicitis. The left lower portion of the abdomen could be colitis or diverticulitis.  You are passing blood in your stools (bright red or black tarry stools, with or without vomiting).  You have blood in your urine.  You develop chills, with or without a fever.  You pass out. MAKE SURE YOU:   Understand these instructions.  Will watch your condition.  Will get help right away if you are not doing well or get worse. Document Released: 01/07/2007 Document Revised: 06/04/2011 Document Reviewed: 01/27/2009 [facility] Patient Information 2014 [place], [place].  Constipation, Adult Constipation is when a person has fewer than 3 bowel movements a week; has difficulty having a bowel movement; or has stools that are  dry, hard, or larger than normal. As people grow older, constipation is more common. If you try to fix constipation with medicines that make you have a bowel movement (laxatives), the problem may get worse. Long-term laxative use may cause the muscles of the colon to become weak. A low-fiber diet, not taking in enough fluids, and taking certain medicines may make constipation worse. CAUSES   Certain medicines, such as antidepressants, pain medicine, iron supplements, antacids, and water pills.   Certain diseases, such as diabetes, irritable bowel syndrome (IBS), thyroid disease, or depression.   Not drinking enough water.   Not eating enough fiber-rich foods.   Stress or travel.  Lack of physical activity or exercise.  Not going to the restroom when there is the urge to have a bowel movement.  Ignoring the urge to have a bowel movement.  Using laxatives too much. SYMPTOMS   Having fewer than 3 bowel movements a week.   Straining to have a bowel movement.   Having hard, dry, or larger than normal stools.   Feeling full or bloated.   Pain in the lower abdomen.  Not feeling relief after having a bowel movement. DIAGNOSIS  Your caregiver will take a medical history and perform a physical exam. Further testing may be done for severe constipation. Some tests may include:   A barium enema X-ray to examine your rectum,  colon, and sometimes, your small intestine.  A sigmoidoscopy to examine your lower colon.  A colonoscopy to examine your entire colon. TREATMENT  Treatment will depend on the severity of your constipation and what is causing it. Some dietary treatments include drinking more fluids and eating more fiber-rich foods. Lifestyle treatments may include regular exercise. If these diet and lifestyle recommendations do not help, your caregiver may recommend taking over-the-counter laxative medicines to help you have bowel movements. Prescription medicines may be  prescribed if over-the-counter medicines do not work.  HOME CARE INSTRUCTIONS   Increase dietary fiber in your diet, such as fruits, vegetables, whole grains, and beans. Limit high-fat and processed sugars in your diet, such as [place] fries, hamburgers, cookies, candies, and soda.   A fiber supplement may be added to your diet if you cannot get enough fiber from foods.   Drink enough fluids to keep your urine clear or pale yellow.   Exercise regularly or as directed by your caregiver.   Go to the restroom when you have the urge to go. Do not hold it.  Only take medicines as directed by your caregiver. Do not take other medicines for constipation without talking to your caregiver first. SEEK IMMEDIATE MEDICAL CARE IF:   You have bright red blood in your stool.   Your constipation lasts for more than 4 days or gets worse.   You have abdominal or rectal pain.   You have thin, pencil-like stools.  You have unexplained weight loss. MAKE SURE YOU:   Understand these instructions.  Will watch your condition.  Will get help right away if you are not doing well or get worse. Document Released: 12/09/2003 Document Revised: 06/04/2011 Document Reviewed: 12/22/2012 [facility] Patient Information 2014 [place], [place]. Diet The clear liquid diet consists of foods that are liquid or will become liquid at room temperature. Examples of foods allowed on a clear liquid diet include fruit juice, broth or bouillon, gelatin, or frozen ice pops. You should be able to see through the liquid. The purpose of this diet is to provide the necessary fluids, electrolytes (such as sodium and potassium), and energy to keep the body functioning during times when you are not able to consume a regular diet. A clear liquid diet should not be continued for long periods of time, as it is not nutritionally adequate.  A CLEAR LIQUID DIET MAY BE NEEDED:  When a sudden-onset (acute) condition occurs before or after  surgery.   As the first step in oral feeding.   For fluid and electrolyte replacement in diarrheal diseases.   As a diet before certain medical tests are performed.  ADEQUACY The clear liquid diet is adequate only in ascorbic acid, according to the Recommended Dietary Allowances of the [employer].  CHOOSING FOODS Breads and Starches  Allowed: None are allowed.   Avoid: All are to be avoided.  Vegetables  Allowed: Strained vegetable juices.   Avoid: Any others.  Fruit  Allowed: Strained fruit juices and fruit drinks. Include 1 serving of citrus or vitamin C-enriched fruit juice daily.   Avoid: Any others.  Meat and Meat Substitutes  Allowed: None are allowed.   Avoid: All are to be avoided.  Milk Products  Allowed: None are allowed.   Avoid: All are to be avoided.  Soups and Combination Foods  Allowed: Clear bouillon, broth, or strained broth-based soups.   Avoid: Any others.  Desserts and Sweets  Allowed: Sugar, honey. High-protein gelatin. Flavored gelatin, ices, or  frozen ice pops that do not contain milk.   Avoid: Any others.  Fats and Oils  Allowed: None are allowed.   Avoid: All are to be avoided.  Beverages  Allowed: Cereal beverages, coffee (regular or decaffeinated), tea, or soda at the discretion of your health care provider.   Avoid: Any others.  Condiments  Allowed: Salt.   Avoid: Any others, including pepper.  Supplements  Allowed: Liquid nutrition beverages that you can see through.   Avoid: Any others that contain lactose or fiber. SAMPLE MEAL PLAN Breakfast  4 oz (120 mL) strained orange juice.   to 1 cup (120 to 240 mL) gelatin (plain or fortified).  1 cup (240 mL) beverage (coffee or tea).  Sugar, if desired. Midmorning Snack   cup (120 mL) gelatin (plain or fortified). Lunch  1 cup (240 mL) broth or consomm.  4 oz (120 mL) strained grapefruit  juice.   cup (120 mL) gelatin (plain or fortified).  1 cup (240 mL) beverage (coffee or tea).  Sugar, if desired. Midafternoon Snack   cup (120 mL) fruit ice.   cup (120 mL) strained fruit juice. Dinner  1 cup (240 mL) broth or consomm.   cup (120 mL) cranberry juice.   cup (120 mL) flavored gelatin (plain or fortified).  1 cup (240 mL) beverage (coffee or tea).  Sugar, if desired. Evening Snack  4 oz (120 mL) strained apple juice (vitamin C-fortified).   cup (120 mL) flavored gelatin (plain or fortified). MAKE SURE YOU:  Understand these instructions.  Will watch your child's condition.  Will get help right away if your child is not doing well or gets worse. Document Released: 03/12/2005 Document Revised: 11/12/2012 Document Reviewed: 08/12/2012 [facility] Patient Information 2014 [place], [place].   Emergency Department Resource Guide 1) Find a Doctor and Pay Out of Pocket Although you won't have to find out who is covered by your insurance plan, it is a good idea to ask around and get recommendations. You will then need to call the office and see if the doctor you have chosen will accept you as a new patient and what types of options they offer for patients who are self-pay. Some doctors offer discounts or will set up payment plans for their patients who do not have insurance, but you will need to ask so you aren't surprised when you get to your appointment.  2) Contact Your Local Health Department Not all health departments have doctors that can see patients for sick visits, but many do, so it is worth a call to see if yours does. If you don't know where your local health department is, you can check in your phone book. The CDC also has a tool to help you locate your state's health department, and many state websites also have listings of all of their local health departments.  3) Find a Walk-in Clinic If your illness is not likely to be very severe or  complicated, you may want to try a walk in clinic. These are popping up all over the country in pharmacies, drugstores, and shopping centers. They're usually staffed by nurse practitioners or physician assistants that have been trained to treat common illnesses and complaints. They're usually fairly quick and inexpensive. However, if you have serious medical issues or chronic medical problems, these are probably not your best option.  No Primary Care Doctor: - Call Health Connect at  [phone] - they can help you locate a primary care doctor that  accepts your  insurance, provides certain services, etc. - Physician Referral Service- [phone]  Chronic Pain Problems: Organization         Address  Phone   Notes  [facility]  [phone] Patients need to be referred by their primary care doctor.   Medication Assistance: Organization         Address  Phone   Notes  [facility] [address] [place] [place], [place] [address] [phone] --Must be a resident of [facility] -- Must have NO insurance coverage whatsoever (no Medicaid/ Medicare, etc.) -- The pt. MUST have a primary care doctor that directs their care regularly and follows them in the community   [facility]ist  [phone]   [employer]  [phone]    Agencies that provide inexpensive medical care: Organization         Address  Phone   Notes  [facility]  [phone]   [facility]    [phone]   [facility] [address] [place], [place] [address] [phone]   [facility] [address] [place] [place] [phone]   Planned Parenthood    [phone]   [facility]    [phone]   [facility]  [address], [place] Phone:  [phone], Fax:  [phone] Hours of Operation:  9 am - 6 pm, M-F.  Also accepts  Medicaid/Medicare and self-pay.  [facility]  [address], [place] Phone: [phone], Fax: [phone]. Hours of Operation:  8:30 am - 5:30 pm, M-F.  Also accepts Medicaid and self-pay.  [facility] [place] [address] [place] Phone: [phone]   [facility] [address] Natasha Bence [place], [place] [phone], Ext. [phone] Mondays & Thursdays: 7-9 AM.  First 15 patients are seen on a first come, first serve basis.    Medicaid-accepting [facility] Providers:  Organization         Address  Phone   Notes  [facility] [address],  [phone] Also accepts self-pay patients.  [facility] [address] Laurell Josephs [place], [place]  [phone]   [facility] [address], [place] [phone]   [facility] [address] [place] [phone]   Renaye Rakers [address], [place]   [phone] Only accepts [place] Access [place] patients after they have their name applied to their card.   Self-Pay (no insurance) in [facility]:  Organization         Address  Phone   Notes  Sickle Cell Patients, [facility] [address] [place], [place] [phone]   [facility]gent Care [address] [place], [place] [phone]   [facility] [place]  [address], [place] [phone]   [facility]/Dr. Osei-Bonsu  [address], [place] or [address], [place] [phone] Phone number for both [place] and [place] locations is the same.  [facility] [address] [facility] [phone]   [facility] [address] [place] or [address] [phone]   [facility] [address], [place] [phone], phone; [phone], fax Sees patients 1st and 3rd Saturday of every month.  Must not qualify for public or private insurance (i.e. Medicaid, Medicare,  [place] Health Choice, Veterans' Benefits)  Household income should be no more than 200% of the poverty level The clinic cannot treat you if you are pregnant or think you are pregnant  Sexually transmitted diseases are not treated at the clinic.    Dental Care: Organization         Address  Phone  Notes  [facility] Department of [facility] [address] [place], [place] [phone] Accepts children up to age 23 who are enrolled in [place] or [place] Health Choice; pregnant women with a Medicaid card; and children who have applied for Medicaid or [place] Health Choice, but were declined, whose parents can pay a reduced fee at time of service.  [facility] Department of [facility]  [address], [place] [phone] Accepts children up to age 47 who are enrolled in [place] or [place] Health Choice; pregnant women with a Medicaid card; and children who have applied for Medicaid or [place] Health Choice, but were declined, whose parents can pay a reduced fee at time of service.  [facility]  [address] [place], [place] [phone] Patients are seen by appointment only. Walk-ins are not accepted. [facility] will see patients 78 years of age and older. Monday - Tuesday (8am-5pm) Most Wednesdays (8:30-5pm) $30 per visit, cash only  [facility] Adult Dental Access [facility]  [address], [facility] [phone] Patients are seen by appointment only. Walk-ins are not accepted. [facility] will see patients 17 years of age and older. One Wednesday Evening (Monthly: Volunteer Based).  $30 per visit, cash only  [employer]  [phone] for adults; Children under age 76, call Graduate Pediatric Dentistry at [phone]. Children aged  51-14, please call [phone] to request a pediatric application.  Dental services are provided in all areas of dental care including fillings, crowns and bridges, complete and partial dentures, implants, gum treatment, root canals, and extractions. Preventive care is also provided. Treatment is provided to both adults and children. Patients are selected via a lottery and there is often a waiting list.   [facility] [address] [place]  [phone] [email or web address]   [facility] [address] [place], [place] [phone], Ext. [phone] Second and Fourth Thursday of each month, opens at 6:30 AM; Clinic ends at 9 AM.  Patients are seen on a first-come first-served basis, and a limited number are seen during each clinic.   [facility]  [address] Ether Griffins [place], [place] [phone]   Eligibility Requirements You must have lived in [place], [place], or [place] counties for at least the last three months.   You cannot be eligible for state or federal sponsored [place], including [employer], [place], or [employer].   You generally cannot be eligible for healthcare insurance through your employer.    How to apply: Eligibility screenings are held every Tuesday and Wednesday afternoon from 1:00 pm until 4:00 pm. You do not need an appointment for the interview!  [facility] [address] [place], [place] [phone]   [facility] Health Department  [phone]   [facility] Health Department  [phone]   [facility] Health Department  [phone]  Behavioral Health Resources in the Community: Intensive Outpatient Programs Organization         Address  Phone  Notes  [facility] Services [address] [place], [place] [phone]   [facility] [address] [place], [place] [phone]   [facility] [address],  [place], [place]  [phone]   [facility] Mental Health [address]  [place], [place] [phone] or [phone]   Substance Abuse Resources Organization         Address  Phone  Notes  [facility] Drug Services  [phone]   [facility]  [phone]   [facility] [place] [facility]  [phone]   Floydene Flock  [phone]   Residential & Outpatient Substance Abuse Program  [phone]   Psychological Services Organization         Address  Phone  Notes  [facility] Behavioral Health  [phone]   [facility]  [phone]   [facility] Mental Health [address] [place] [phone] or [phone]    Mobile Crisis Teams Organization         Address  Phone  Notes  [facility], Mobile Crisis Care Unit  [phone]   [facility]  [address] [place], [place] [phone]   Doristine Locks [address] [place] [address] [place] [phone]    Self-Help/Support Groups Organization         Address  Phone             Notes  [facility] [place] - variety of support groups  [phone]- [phone] Call for more information  Narcotics Anonymous (NA), [facility] [address], [employer] [place]  2 meetings at this location   Statistician         Address  Phone  Notes  [facility] [address],    [place]  [phone]   [facility]  [address], [place] [address], [place], [place] [phone]   [facility] [address] [place], [place] [phone] Admissions: 8am-3pm M-F  [facility] [address],    [place], [place] [phone]   [facility] [address] [place], [place], [place] [phone]   [facility] [address]  [place], [place] [phone]   [facility] - Intensive Outpatient [address] Laurell Josephs [address], [place], [place] [phone]   [facility]  ([facility] [address] [place]  [place], [place] [phone] or [phone]   [facility] ([facility]) [address], [place], [place] [phone] Accepts Medicaid  [facility] [address],  [place] [phone] Substance Abuse/Addiction Treatment   [facility]         Address  Phone  Notes  [facility]  [phone]   Angie Fava, PhD [address] [place], [place]   [phone] or [phone]   [facility]   [address] [place], [place] [phone]   [facility] [address], [place], [place] [phone] Insurance/Medicaid/sponsorship through [employer] [facility] [address]                                    [place], [place] [phone] Therapy/tele-psych/case  [facility] [address]  [place], [place] [phone]    Dr. Adele Schilder  [phone]   [facility] Dept. 1) [address] [place] 2) [place] 3)  [place] [address], [place] [phone]  [phone]   [place] [phone] or [phone] (After Hours)

## 2013-04-11 NOTE — ED Notes (Signed)
Patient transported to Ultrasound 

## 2013-04-11 NOTE — ED Notes (Signed)
Patient returned from X-ray 

## 2013-04-11 NOTE — ED Notes (Signed)
Tolerated water and crackers.

## 2013-04-12 NOTE — ED Provider Notes (Signed)
Medical screening examination/treatment/procedure(s) were performed by non-physician practitioner and as supervising physician I was immediately available for consultation/collaboration.  EKG Interpretation   None        Doug SouSam Marja Adderley, MD 04/12/13 [phone]

## 2014-01-10 ENCOUNTER — Encounter (HOSPITAL_COMMUNITY): Payer: Self-pay | Admitting: Emergency Medicine

## 2014-01-10 ENCOUNTER — Emergency Department (HOSPITAL_COMMUNITY)
Admission: EM | Admit: 2014-01-10 | Discharge: 2014-01-10 | Disposition: A | Discharge status: Home / Self Care | Payer: Medicaid Other | Attending: Emergency Medicine | Admitting: Emergency Medicine

## 2014-01-10 DIAGNOSIS — L0501 Pilonidal cyst with abscess: Secondary | ICD-10-CM | POA: Insufficient documentation

## 2014-01-10 DIAGNOSIS — Z720 Tobacco use: Secondary | ICD-10-CM | POA: Insufficient documentation

## 2014-01-10 DIAGNOSIS — Z79899 Other long term (current) drug therapy: Secondary | ICD-10-CM | POA: Insufficient documentation

## 2014-01-10 DIAGNOSIS — Z91040 Latex allergy status: Secondary | ICD-10-CM | POA: Diagnosis not present

## 2014-01-10 DIAGNOSIS — B9689 Other specified bacterial agents as the cause of diseases classified elsewhere: Secondary | ICD-10-CM

## 2014-01-10 DIAGNOSIS — N760 Acute vaginitis: Secondary | ICD-10-CM | POA: Insufficient documentation

## 2014-01-10 DIAGNOSIS — A5901 Trichomonal vulvovaginitis: Secondary | ICD-10-CM | POA: Diagnosis not present

## 2014-01-10 DIAGNOSIS — L0231 Cutaneous abscess of buttock: Secondary | ICD-10-CM | POA: Diagnosis present

## 2014-01-10 DIAGNOSIS — A599 Trichomoniasis, unspecified: Secondary | ICD-10-CM

## 2014-01-10 LAB — WET PREP, GENITAL: Yeast Wet Prep HPF POC: NONE SEEN

## 2014-01-10 MED ORDER — METRONIDAZOLE 500 MG PO TABS: 500.0000 mg | ORAL_TABLET | Freq: Two times a day (BID) | ORAL | Status: DC

## 2014-01-10 MED ORDER — LIDOCAINE-EPINEPHRINE (PF) 2 %-1:200000 IJ SOLN
10.0000 mL | Freq: Once | INTRAMUSCULAR | Status: AC
  Administered 2014-01-10: 10 mL
  Filled 2014-01-10: qty 20

## 2014-01-10 MED ORDER — TRAMADOL HCL 50 MG PO TABS
50.0000 mg | ORAL_TABLET | Freq: Four times a day (QID) | ORAL | Status: DC | PRN
Start: 2014-01-10 — End: 2015-02-13

## 2014-01-10 NOTE — ED Provider Notes (Signed)
CSN: [id number]     Arrival date & time 01/10/14  1759 History  This chart was scribed for non-physician practitioner, Junius FinnerErin O'Malley, PA-C working with Glynn OctaveStephen Rancour, MD by Greggory StallionKayla Andersen, ED scribe. This patient was seen in room TR11C/TR11C and the patient's care was started at 6:34 PM.   Chief Complaint  Patient presents with  . Abscess   The history is provided by the patient. No language interpreter was used.   HPI Comments: Julia Russell is a 23 y.o. female who presents to the Emergency Department complaining of an abscess to the top of her buttocks that started one week ago. States pain and swelling have increased. Pressure and sitting worsens the pain, 10/10. Pt has done warm bath soaks with epsom salt with no relief. Reports vaginal itching and white discharge that started yesterday. Denies recent intercourse without protection. Pt has history of abscesses and states they have been I&D'd. LMP: unknown, states she does not have cycles anymore as she has implanon.   History reviewed. No pertinent past medical history. History reviewed. No pertinent past surgical history. No family history on file. History  Substance Use Topics  . Smoking status: Current Every Day Smoker  . Smokeless tobacco: Not on file  . Alcohol Use: Yes   OB History   Grav Para Term Preterm Abortions TAB SAB Ect Mult Living                 Review of Systems  Genitourinary: Positive for vaginal discharge.  Skin:       Abscess.  All other systems reviewed and are negative.  Allergies  Latex  Home Medications   Prior to Admission medications   Medication Sig Start Date End Date Taking? Authorizing Provider  fluconazole (DIFLUCAN) 150 MG tablet 1 tab po q72 h for 2 doses 12/04/11   Adlih Moreno-Coll, MD  metroNIDAZOLE (FLAGYL) 500 MG tablet Take 1 tablet (500 mg total) by mouth 2 (two) times daily. One po bid x 7 days 01/10/14   Junius FinnerErin O'Malley, PA-C  ondansetron (ZOFRAN) 4 MG tablet Take 1 tablet (4  mg total) by mouth every 6 (six) hours. 04/11/13   Marissa Sciacca, PA-C  polyethylene glycol (MIRALAX / GLYCOLAX) packet Take 17 g by mouth daily. 04/11/13   Marissa Sciacca, PA-C  traMADol (ULTRAM) 50 MG tablet Take 1 tablet (50 mg total) by mouth every 6 (six) hours as needed. 01/10/14   Junius FinnerErin O'Malley, PA-C   BP 111/78  Pulse 87  Temp(Src) 98.8 F (37.1 C) (Oral)  Resp 18  Ht 5\' 7"  (1.702 m)  Wt 177 lb (80.287 kg)  BMI 27.72 kg/m2  SpO2 99%  Physical Exam  Nursing note and vitals reviewed. Constitutional: She is oriented to person, place, and time. She appears well-developed and well-nourished.  HENT:  Head: Normocephalic and atraumatic.  Eyes: EOM are normal.  Neck: Normal range of motion.  Cardiovascular: Normal rate.   Pulmonary/Chest: Effort normal.  Genitourinary:  Chaperone present. Normal external genitalia. No vaginal bleeding. Small amount of white, thick vaginal discharge. No cervical motion tenderness. No adnexal tenderness.   Musculoskeletal: Normal range of motion.  Neurological: She is alert and oriented to person, place, and time.  Skin: Skin is warm and dry.  1 x 2 cm area of induration and tenderness to top of gluteal cleft. No fluctuance. No erythema. No active discharge. No red streaking.   Psychiatric: She has a normal mood and affect. Her behavior is normal.    ED  Course  Procedures (including critical care time)  INCISION AND DRAINAGE Performed by: Junius FinnerErin O'Malley, PA-C Consent: Verbal consent obtained. Risks and benefits: risks, benefits and alternatives were discussed Type: abscess  Body area: buttock  Anesthesia: local infiltration  Incision was made with a scalpel.  Local anesthetic: lidocaine 2% with epinephrine  Anesthetic total: 2 ml  Complexity: complex Blunt dissection to break up loculations  Drainage: purulent  Drainage amount: 3 mL  Packing material: none  Patient tolerance: Patient tolerated the procedure well with no  immediate complications.   DIAGNOSTIC STUDIES: Oxygen Saturation is 99% on RA, normal by my interpretation.    COORDINATION OF CARE: 6:36 PM-Discussed treatment plan which includes pelvic exam and I&D with pt at bedside and pt agreed to plan.   Labs Review Labs Reviewed  WET PREP, GENITAL - Abnormal; Notable for the following:    Trich, Wet Prep MODERATE (*)    Clue Cells Wet Prep HPF POC FEW (*)    WBC, Wet Prep HPF POC FEW (*)    All other components within normal limits  GC/CHLAMYDIA PROBE AMP    Imaging Review No results found.   EKG Interpretation None      MDM   Final diagnoses:  Pilonidal abscess  Trichimoniasis  Bacterial vaginosis    Pt presenting with pilonidal abscess, successfully I&D.  No immediate complications.  Home care instructions provided. Pt states pain improved from 10/10 to 0/10 after I&D. Advised to f/u in 2-3 days for wound recheck.  Pt also found to have trichomoniasis and BV. Rx: flagyl.  Home care instructions provided.  Advised to f/u with PCP for recheck of symptoms in 1 week if not improving.   Return precautions provided. Pt verbalized understanding and agreement with tx plan.  I personally performed the services described in this documentation, which was scribed in my presence. The recorded information has been reviewed and is accurate.  Junius Finnerrin O'Malley, PA-C 01/10/14 2103

## 2014-01-10 NOTE — ED Provider Notes (Signed)
Medical screening examination/treatment/procedure(s) were performed by non-physician practitioner and as supervising physician I was immediately available for consultation/collaboration.   EKG Interpretation None        Glynn OctaveStephen Kerrion Kemppainen, MD 01/10/14 2342

## 2014-01-10 NOTE — ED Notes (Signed)
C/o boil on buttocks x 1 week.  States she has tried warm bath soaks with Epsom salt without relief.  Also c/o vaginal itching since yesterday.

## 2014-01-11 LAB — GC/CHLAMYDIA PROBE AMP
CT Probe RNA: NEGATIVE
GC Probe RNA: NEGATIVE

## 2014-01-27 ENCOUNTER — Emergency Department (HOSPITAL_COMMUNITY)
Admission: EM | Admit: 2014-01-27 | Discharge: 2014-01-27 | Disposition: A | Discharge status: Home / Self Care | Payer: Medicaid Other | Attending: Emergency Medicine | Admitting: Emergency Medicine

## 2014-01-27 ENCOUNTER — Encounter (HOSPITAL_COMMUNITY): Payer: Self-pay | Admitting: Emergency Medicine

## 2014-01-27 DIAGNOSIS — L0231 Cutaneous abscess of buttock: Secondary | ICD-10-CM | POA: Diagnosis not present

## 2014-01-27 DIAGNOSIS — Z720 Tobacco use: Secondary | ICD-10-CM | POA: Diagnosis not present

## 2014-01-27 DIAGNOSIS — Z79899 Other long term (current) drug therapy: Secondary | ICD-10-CM | POA: Insufficient documentation

## 2014-01-27 DIAGNOSIS — Z792 Long term (current) use of antibiotics: Secondary | ICD-10-CM | POA: Insufficient documentation

## 2014-01-27 DIAGNOSIS — Z91040 Latex allergy status: Secondary | ICD-10-CM | POA: Insufficient documentation

## 2014-01-27 DIAGNOSIS — L0291 Cutaneous abscess, unspecified: Secondary | ICD-10-CM

## 2014-01-27 HISTORY — DX: Cutaneous abscess, unspecified: L02.91

## 2014-01-27 MED ORDER — HYDROCODONE-ACETAMINOPHEN 5-325 MG PO TABS
1.0000 | ORAL_TABLET | Freq: Four times a day (QID) | ORAL | Status: DC | PRN
Start: 2014-01-27 — End: 2015-02-13

## 2014-01-27 MED ORDER — OXYCODONE-ACETAMINOPHEN 5-325 MG PO TABS
2.0000 | ORAL_TABLET | Freq: Once | ORAL | Status: AC
  Administered 2014-01-27: 2 via ORAL
  Filled 2014-01-27: qty 2

## 2014-01-27 MED ORDER — CEPHALEXIN 500 MG PO CAPS: 500.0000 mg | ORAL_CAPSULE | Freq: Four times a day (QID) | ORAL | Status: DC

## 2014-01-27 MED ORDER — LIDOCAINE-EPINEPHRINE 2 %-1:100000 IJ SOLN
20.0000 mL | Freq: Once | INTRAMUSCULAR | Status: AC
  Administered 2014-01-27: 20 mL via INTRADERMAL
  Filled 2014-01-27: qty 20

## 2014-01-27 NOTE — ED Provider Notes (Signed)
CSN: [id number]     Arrival date & time 01/27/14  0820 History   First MD Initiated Contact with Patient 01/27/14 [phone]     Chief Complaint  Patient presents with  . Abscess     (Consider location/radiation/quality/duration/timing/severity/associated sxs/prior Treatment) HPI Comments: Patient presents to the emergency department with chief complaint of abscess. She states that she noticed to developing abscesses on her left buttock 2 days ago. She states that she gets these several times per year. She denies any fever, but does report subjective chills. She reports intense pain at the site. She denies taking anything for her symptoms. She denies any nausea, vomiting, diarrhea, constipation. The pain is aggravated with sitting and with palpation. It is improved with rest.  The history is provided by the patient. No language interpreter was used.    Past Medical History  Diagnosis Date  . Abscess    History reviewed. No pertinent past surgical history. History reviewed. No pertinent family history. History  Substance Use Topics  . Smoking status: Current Every Day Smoker  . Smokeless tobacco: Not on file  . Alcohol Use: Yes   OB History    No data available     Review of Systems  Constitutional: Negative for fever and chills.  Respiratory: Negative for shortness of breath.   Cardiovascular: Negative for chest pain.  Gastrointestinal: Negative for nausea, vomiting, diarrhea and constipation.  Genitourinary: Negative for dysuria.  Skin: Positive for wound.  All other systems reviewed and are negative.     Allergies  Latex  Home Medications   Prior to Admission medications   Medication Sig Start Date End Date Taking? Authorizing Provider  fluconazole (DIFLUCAN) 150 MG tablet 1 tab po q72 h for 2 doses 12/04/11   Adlih Moreno-Coll, MD  metroNIDAZOLE (FLAGYL) 500 MG tablet Take 1 tablet (500 mg total) by mouth 2 (two) times daily. One po bid x 7 days 01/10/14   Junius FinnerErin  O'Malley, PA-C  ondansetron (ZOFRAN) 4 MG tablet Take 1 tablet (4 mg total) by mouth every 6 (six) hours. 04/11/13   Marissa Sciacca, PA-C  polyethylene glycol (MIRALAX / GLYCOLAX) packet Take 17 g by mouth daily. 04/11/13   Marissa Sciacca, PA-C  traMADol (ULTRAM) 50 MG tablet Take 1 tablet (50 mg total) by mouth every 6 (six) hours as needed. 01/10/14   Junius FinnerErin O'Malley, PA-C   BP 114/61 mmHg  Pulse 90  Temp(Src) 98.8 F (37.1 C) (Oral)  Resp 20  SpO2 100% Physical Exam  Constitutional: She is oriented to person, place, and time. She appears well-developed and well-nourished.  HENT:  Head: Normocephalic and atraumatic.  Eyes: Conjunctivae and EOM are normal.  Neck: Normal range of motion.  Cardiovascular: Normal rate.   Pulmonary/Chest: Effort normal.  Abdominal: She exhibits no distension.  Musculoskeletal: Normal range of motion.  Neurological: She is alert and oriented to person, place, and time.  Skin: Skin is dry.  Left buttock remarkable for 2, 2 x 2 centimeter abscesses, mild surrounding erythema  Psychiatric: She has a normal mood and affect. Her behavior is normal. Judgment and thought content normal.  Nursing note and vitals reviewed.   ED Course  Procedures (including critical care time) Labs Review Labs Reviewed - No data to display  Imaging Review No results found.   EKG Interpretation None      MDM   Final diagnoses:  Abscess     Patient with skin abscess amenable to incision and drainage.  Abscess was not large enough  to warrant packing or drain,  wound recheck in 2 days. Encouraged home warm soaks and flushing.  Mild signs of cellulitis is surrounding skin.  Will d/c to home.   I&D performed by resident Dr. Beckie Saltsivet.  See attached procedure note.     Roxy Horsemanobert Kasy Iannacone, PA-C 01/27/14 [phone]  Purvis SheffieldForrest Harrison, MD 01/28/14 [phone]

## 2014-01-27 NOTE — ED Provider Notes (Signed)
INCISION AND DRAINAGE Performed by: Rich Numberivet, Carly Consent: Verbal consent obtained. Risks and benefits: risks, benefits and alternatives were discussed Type: abscess  Body area: Left buttock  Anesthesia: local infiltration  Incision was made with a scalpel.  Local anesthetic: lidocaine 2% with epinephrine  Anesthetic total: 10 ml  Complexity: complex Blunt dissection to break up loculations  Drainage: purulent  Drainage amount: 3cc  Packing material: No packing necessary. Covered with clean gauze.   Patient tolerance: Patient tolerated the procedure well with no immediate complications.    Rich Numberarly Rivet, MD 01/27/14 [phone]  Purvis SheffieldForrest Harrison, MD 01/28/14 [phone]

## 2014-01-27 NOTE — Discharge Instructions (Signed)

## 2014-01-27 NOTE — ED Notes (Signed)
Pt states she developed an abscess on left buttock two days ago. Pt states she has been in and out of hospital this year with abscesses on her buttocks.

## 2014-10-17 ENCOUNTER — Inpatient Hospital Stay (HOSPITAL_COMMUNITY)
Admission: AD | Admit: 2014-10-17 | Discharge: 2014-10-17 | Disposition: A | Discharge status: Home / Self Care | Payer: Medicaid Other | Source: Ambulatory Visit | Attending: Obstetrics and Gynecology | Admitting: Obstetrics and Gynecology

## 2014-10-17 DIAGNOSIS — F17210 Nicotine dependence, cigarettes, uncomplicated: Secondary | ICD-10-CM | POA: Insufficient documentation

## 2014-10-17 DIAGNOSIS — N926 Irregular menstruation, unspecified: Secondary | ICD-10-CM

## 2014-10-17 DIAGNOSIS — N911 Secondary amenorrhea: Secondary | ICD-10-CM | POA: Diagnosis not present

## 2014-10-17 DIAGNOSIS — Z3202 Encounter for pregnancy test, result negative: Secondary | ICD-10-CM | POA: Insufficient documentation

## 2014-10-17 DIAGNOSIS — N910 Primary amenorrhea: Secondary | ICD-10-CM | POA: Insufficient documentation

## 2014-10-17 LAB — POCT PREGNANCY, URINE: Preg Test, Ur: NEGATIVE

## 2014-10-17 NOTE — Discharge Instructions (Signed)
Menstruation °Menstruation is the monthly passing of blood, tissue, fluid and mucus, also know as a period. Your body is shedding the lining of the uterus. The flow, or amount of blood, usually lasts from 3-7 days each month. Hormones control the menstrual cycle. Hormones are a chemical substance produced by endocrine glands in the body to regulate different bodily functions. °The first menstrual period may start any time between age 26 years to 16 years. However, it usually starts around age 12 years. Some girls have regular monthly menstrual cycles right from the beginning. However, it is not unusual to have only a couple of drops of blood or spotting when you first start menstruating. It is also not unusual to have two periods a month or miss a month or two when first starting your periods. °SYMPTOMS  °· Mild to moderate abdominal cramps. °· Aching or pain in the lower back area. °Symptoms may occur 5-10 days before your menstrual period starts. These symptoms are referred to as premenstrual syndrome (PMS). These symptoms can include: °· Headache. °· Breast tenderness and swelling. °· Bloating. °· Tiredness (fatigue). °· Mood changes. °· Craving for certain foods. °These are normal signs and symptoms and can vary in severity. To help relieve these problems, ask your caregiver if you can take over-the-counter medications for pain or discomfort. If the symptoms are not controllable, see your caregiver for help.  °HORMONES INVOLVED IN MENSTRUATION °Menstruation comes about because of hormones produced by the pituitary gland in the brain and the ovaries that affect the uterine lining. °First, the pituitary gland in the brain produces the hormone follicle stimulating hormone (FSH). FSH stimulates the ovaries to produce estrogen, which thickens the uterine lining and begins to develop an egg in the ovary. About 14 days later, the pituitary gland produces another hormone called luteinizing hormone (LH). LH causes the egg  to come out of a sac in the ovary (ovulation). The empty sac on the ovary called the corpus luteum is stimulated by another hormone from the pituitary gland called luteotropin. The corpus luteum begins to produce the estrogen and progesterone hormone. The progesterone hormone prepares the lining of the uterus to have the fertilized egg (egg combined with sperm) attach to the lining of the uterus and begin to develop into a fetus. If the egg is not fertilized, the corpus luteum stops producing estrogen and progesterone, it disappears, the lining of the uterus sloughs off and a menstrual period begins. Then the menstrual cycle starts all over again and will continue monthly unless pregnancy occurs or menopause begins. °The secretion of hormones is complex. Various parts of the body become involved in many chemical activities. Female sex hormones have other functions in a woman's body as well. Estrogen increases a woman's sex drive (libido). It naturally helps body get rid of fluids (diuretic). It also aids in the process of building new bone. Therefore, maintaining hormonal health is essential to all levels of a woman's well being. These hormones are usually present in normal amounts and cause you to menstruate. It is the relationship between the (small) levels of the hormones that is critical. When the balance is upset, menstrual irregularities can occur. °HOW DOES THE MENSTRUAL CYCLE HAPPEN? °· Menstrual cycles vary in length from 21-35 days with an average of 29 days. The cycle begins on the first day of bleeding. At this time, the pituitary gland in the brain releases FSH that travels through the bloodstream to the ovaries. The FSH stimulates the follicles in the   ovaries. This prepares the body for ovulation that occurs around the 14th day of the cycle. The ovaries produce estrogen, and this makes sure conditions are right in the uterus for implantation of the fertilized egg. °· When the levels of estrogen reach a  high enough level, it signals the gland in the brain (pituitary gland) to release a surge of LH. This causes the release of the ripest egg from its follicle (ovulation). Usually only one follicle releases one egg, but sometimes more than one follicle releases an egg especially when stimulating the ovaries for in vitro fertilization. The egg can then be collected by either fallopian tube to await fertilization. The burst follicle within the ovary that is left behind is now called the corpus luteum or "yellow body." The corpus luteum continues to give off (secrete) reduced amounts of estrogen. This closes and hardens the cervix. It dries up the mucus to the naturally infertile condition. °· The corpus luteum also begins to give off greater amounts of progesterone. This causes the lining of the uterus (endometrium) to thicken even more in preparation for the fertilized egg. The egg is starting to journey down from the fallopian tube to the uterus. It also signals the ovaries to stop releasing eggs. It assists in returning the cervical mucus to its infertile state. °· If the egg implants successfully into the womb lining and pregnancy occurs, progesterone levels will continue to raise. It is often this hormone that gives some pregnant women a feeling of well being, like a "natural high." Progesterone levels drop again after childbirth. °· If fertilization does not occur, the corpus luteum dies, stopping the production of hormones. This sudden drop in progesterone causes the uterine lining to break down, accompanied by blood (menstruation). °· This starts the cycle back at day 1. The whole process starts all over again. Woman go through this cycle every month from puberty to menopause. Women have breaks only for pregnancy and breastfeeding (lactation), unless the woman has health problems that affect the female hormone system or chooses to use oral contraceptives to have unnatural menstrual periods. °HOME CARE  INSTRUCTIONS  °· Keep track of your periods by using a calendar. °· If you use tampons, get the least absorbent to avoid toxic shock syndrome. °· Do not leave tampons in the vagina over night or longer than 6 hours. °· Wear a sanitary pad over night. °· Exercise 3-5 times a week or more. °· Avoid foods and drinks that you know will make your symptoms worse before or during your period. °SEEK MEDICAL CARE IF:  °· You develop a fever with your period. °· Your periods are lasting more than 7 days. °· Your period is so heavy that you have to change pads or tampons every 30 minutes. °· You develop clots with your period and never had clots before. °· You cannot get relief from over-the-counter medication for your symptoms. °· Your period has not started, and it has been longer than 35 days. °Document Released: 03/02/2002 Document Revised: 03/17/2013 Document Reviewed: 10/09/2012 °Exit[employer]® Patient Information ©2015 [employer]. This information is not intended to replace advice given to you by your health care provider. Make sure you discuss any questions you have with your health care provider. ° °

## 2014-10-17 NOTE — MAU Note (Signed)
Patient presents with c/o 38 days since her last menstrual cycle and wants to know if she is pregnant. Denies pain, bleeding or discharge.

## 2014-10-17 NOTE — MAU Provider Note (Signed)
History     CSN: [id number]  Arrival date and time: 10/17/14 1739   None     Chief Complaint  Patient presents with  . Possible Pregnancy   HPI This is a 24 y.o. female who presents with complaint of being 8 days late for her period. States she is trying to get pregnant and wants to know if she is pregnant. Has not taken any tests at home.  Denies pain, or bleeding. Has no other concerns other than her period being a little late.   RN Note: Patient presents with c/o 38 days since her last menstrual cycle and wants to know if she is pregnant. Denies pain, bleeding or discharge.          OB History    No data available      Past Medical History  Diagnosis Date  . Abscess     No past surgical history on file.  No family history on file.  History  Substance Use Topics  . Smoking status: Current Every Day Smoker  . Smokeless tobacco: Not on file  . Alcohol Use: Yes    Allergies:  Allergies  Allergen Reactions  . Latex Itching    Prescriptions prior to admission  Medication Sig Dispense Refill Last Dose  . cephALEXin (KEFLEX) 500 MG capsule Take 1 capsule (500 mg total) by mouth 4 (four) times daily. 40 capsule 0   . fluconazole (DIFLUCAN) 150 MG tablet 1 tab po q72 h for 2 doses (Patient not taking: Reported on 01/27/2014) 2 tablet 0 Completed Course at Unknown time  . HYDROcodone-acetaminophen (NORCO/VICODIN) 5-325 MG per tablet Take 1-2 tablets by mouth every 6 (six) hours as needed. 10 tablet 0   . metroNIDAZOLE (FLAGYL) 500 MG tablet Take 1 tablet (500 mg total) by mouth 2 (two) times daily. One po bid x 7 days (Patient not taking: Reported on 01/27/2014) 14 tablet 0   . ondansetron (ZOFRAN) 4 MG tablet Take 1 tablet (4 mg total) by mouth every 6 (six) hours. (Patient not taking: Reported on 01/27/2014) 12 tablet 0   . polyethylene glycol (MIRALAX / GLYCOLAX) packet Take 17 g by mouth daily. (Patient not taking: Reported on 01/27/2014) 14 each 0   . traMADol  (ULTRAM) 50 MG tablet Take 1 tablet (50 mg total) by mouth every 6 (six) hours as needed. (Patient not taking: Reported on 01/27/2014) 15 tablet 0     Review of Systems  Constitutional: Negative for fever, chills and malaise/fatigue.  Gastrointestinal: Negative for nausea, vomiting, abdominal pain, diarrhea and constipation.  Genitourinary: Negative for dysuria.  Musculoskeletal: Negative for back pain.  Neurological: Negative for weakness.  Other systems negative  Physical Exam   Blood pressure 117/67, pulse 96, temperature 98.3 F (36.8 C), temperature source Oral, resp. rate 16, height  (1.702 m), weight 130 lb (58.968 kg), last menstrual period 09/03/2014.  Physical Exam  Constitutional: She is oriented to person, place, and time. She appears well-developed and well-nourished. No distress.  HENT:  Head: Normocephalic.  Cardiovascular: Normal rate.   Respiratory: Effort normal. No respiratory distress.  GI: Soft. She exhibits no distension. There is no tenderness. There is no rebound and no guarding.  Genitourinary:  Exam not indicated   Musculoskeletal: Normal range of motion.  Neurological: She is alert and oriented to person, place, and time.  Skin: Skin is warm and dry.  Psychiatric: She has a normal mood and affect.    MAU Course  Procedures  MDM Urine  pregnancy test = Negative  Assessment and Plan  A:  Late menses, first episode       Discussed this may be related to not ovulating this month or ovulating an abnormal egg       Negative pregnancy test       No other complaints  P;  Discharge home       Reviewed normal menstrual function       Reassured it is normal to be late every once and a whlle       Reviewed general principles for conception       Followup with OB / GYN doctor  [facility] 10/17/2014, 6:09 PM

## 2014-11-26 ENCOUNTER — Emergency Department (HOSPITAL_COMMUNITY)
Admission: EM | Admit: 2014-11-26 | Discharge: 2014-11-26 | Disposition: A | Discharge status: Home / Self Care | Payer: Medicaid Other | Attending: Emergency Medicine | Admitting: Emergency Medicine

## 2014-11-26 ENCOUNTER — Encounter (HOSPITAL_COMMUNITY): Payer: Self-pay | Admitting: *Deleted

## 2014-11-26 DIAGNOSIS — Z720 Tobacco use: Secondary | ICD-10-CM | POA: Diagnosis not present

## 2014-11-26 DIAGNOSIS — N898 Other specified noninflammatory disorders of vagina: Secondary | ICD-10-CM | POA: Diagnosis present

## 2014-11-26 DIAGNOSIS — Z91040 Latex allergy status: Secondary | ICD-10-CM | POA: Insufficient documentation

## 2014-11-26 DIAGNOSIS — Z872 Personal history of diseases of the skin and subcutaneous tissue: Secondary | ICD-10-CM | POA: Diagnosis not present

## 2014-11-26 DIAGNOSIS — Z3202 Encounter for pregnancy test, result negative: Secondary | ICD-10-CM | POA: Insufficient documentation

## 2014-11-26 LAB — URINALYSIS, ROUTINE W REFLEX MICROSCOPIC
GLUCOSE, UA: NEGATIVE mg/dL
Hgb urine dipstick: NEGATIVE
KETONES UR: 15 mg/dL — AB
LEUKOCYTES UA: NEGATIVE
Nitrite: NEGATIVE
PH: 7 (ref 5.0–8.0)
Protein, ur: NEGATIVE mg/dL
Specific Gravity, Urine: 1.022 (ref 1.005–1.030)
Urobilinogen, UA: 2 mg/dL — ABNORMAL HIGH (ref 0.0–1.0)

## 2014-11-26 LAB — URINE MICROSCOPIC-ADD ON

## 2014-11-26 LAB — WET PREP, GENITAL
TRICH WET PREP: NONE SEEN
Yeast Wet Prep HPF POC: NONE SEEN

## 2014-11-26 LAB — POC URINE PREG, ED: Preg Test, Ur: NEGATIVE

## 2014-11-26 MED ORDER — AZITHROMYCIN 250 MG PO TABS: 1000.0000 mg | ORAL_TABLET | Freq: Once | ORAL | Status: DC

## 2014-11-26 MED ORDER — CEFTRIAXONE SODIUM 250 MG IJ SOLR
250.0000 mg | Freq: Once | INTRAMUSCULAR | Status: AC
  Administered 2014-11-26: 250 mg via INTRAMUSCULAR
  Filled 2014-11-26: qty 250

## 2014-11-26 MED ORDER — DOXYCYCLINE HYCLATE 100 MG PO CAPS: 100.0000 mg | ORAL_CAPSULE | Freq: Two times a day (BID) | ORAL | Status: DC

## 2014-11-26 MED ORDER — LIDOCAINE HCL (PF) 1 % IJ SOLN
0.9000 mL | Freq: Once | INTRAMUSCULAR | Status: AC
  Administered 2014-11-26: 0.9 mL
  Filled 2014-11-26: qty 5

## 2014-11-26 NOTE — ED Notes (Signed)
Pt requesting std/hiv tests. Denies any symptoms.

## 2014-11-26 NOTE — ED Provider Notes (Signed)
CSN: [id number]     Arrival date & time 11/26/14  1301 History   First MD Initiated Contact with Patient 11/26/14 1402     Chief Complaint  Patient presents with  . SEXUALLY TRANSMITTED DISEASE    HPI  Julia Russell is a 24 year old female presenting today for STD and pregnancy testing. Pt states that her brother tested positive for HIV last week and she promised him that she would get tested as well. No known STD exposure. Pt reports she noted some new yellow vaginal discharge in the bathroom while giving urine sample. Denies dysuria, hematuria, pelvic pain, itching, rashes or lesions. LMP was 3 weeks ago. She is not currently using contraception. Denies fevers, chest pain, SOB, abdominal pain, nausea or vomiting.    Past Medical History  Diagnosis Date  . Abscess    History reviewed. No pertinent past surgical history. History reviewed. No pertinent family history. Social History  Substance Use Topics  . Smoking status: Current Every Day Smoker  . Smokeless tobacco: None  . Alcohol Use: Yes   OB History    No data available     Review of Systems  Constitutional: Negative for fever and chills.  Respiratory: Negative for shortness of breath.   Cardiovascular: Negative for chest pain.  Gastrointestinal: Negative for nausea, vomiting and abdominal pain.  Genitourinary: Positive for vaginal discharge. Negative for dysuria, hematuria, genital sores, vaginal pain and pelvic pain.  Skin: Negative for rash.  Neurological: Negative for headaches.      Allergies  Latex  Home Medications   Prior to Admission medications   Medication Sig Start Date End Date Taking? Authorizing Provider  cephALEXin (KEFLEX) 500 MG capsule Take 1 capsule (500 mg total) by mouth 4 (four) times daily. Patient not taking: Reported on 11/26/2014 01/27/14   Roxy Horseman, PA-C  doxycycline (VIBRAMYCIN) 100 MG capsule Take 1 capsule (100 mg total) by mouth 2 (two) times daily. 11/26/14   Ermelinda Eckert, PA-C   fluconazole (DIFLUCAN) 150 MG tablet 1 tab po q72 h for 2 doses Patient not taking: Reported on 01/27/2014 12/04/11   Christin Fudge Moreno-Coll, MD  HYDROcodone-acetaminophen (NORCO/VICODIN) 5-325 MG per tablet Take 1-2 tablets by mouth every 6 (six) hours as needed. Patient not taking: Reported on 11/26/2014 01/27/14   Roxy Horseman, PA-C  metroNIDAZOLE (FLAGYL) 500 MG tablet Take 1 tablet (500 mg total) by mouth 2 (two) times daily. One po bid x 7 days Patient not taking: Reported on 01/27/2014 01/10/14   Junius Finner, PA-C  ondansetron (ZOFRAN) 4 MG tablet Take 1 tablet (4 mg total) by mouth every 6 (six) hours. Patient not taking: Reported on 01/27/2014 04/11/13   Marissa Sciacca, PA-C  polyethylene glycol (MIRALAX / GLYCOLAX) packet Take 17 g by mouth daily. Patient not taking: Reported on 01/27/2014 04/11/13   Marissa Sciacca, PA-C  traMADol (ULTRAM) 50 MG tablet Take 1 tablet (50 mg total) by mouth every 6 (six) hours as needed. Patient not taking: Reported on 01/27/2014 01/10/14   Junius Finner, PA-C   BP 100/63 mmHg  Pulse 61  Temp(Src) 98.5 F (36.9 C) (Oral)  Resp 18  Ht 5\' 7"  (1.702 m)  Wt 152 lb 11.2 oz (69.264 kg)  BMI 23.91 kg/m2  SpO2 100%  LMP 11/12/2014 Physical Exam  Constitutional: She is oriented to person, place, and time. She appears well-developed and well-nourished. No distress.  HENT:  Head: Normocephalic and atraumatic.  Neck: Normal range of motion.  Cardiovascular: Normal rate, regular rhythm and  normal heart sounds.   Pulmonary/Chest: Effort normal and breath sounds normal. No respiratory distress. She has no wheezes.  Abdominal: Soft. There is no tenderness.  Genitourinary: There is no rash or lesion on the right labia. There is no rash or lesion on the left labia. Cervix exhibits motion tenderness and discharge (green, yellow discharge). Cervix exhibits no friability. Right adnexum displays no mass and no tenderness. Left adnexum displays no mass and no tenderness. No  erythema in the vagina. No vaginal discharge found.  Musculoskeletal: Normal range of motion.  Neurological: She is alert and oriented to person, place, and time.  Skin: Skin is warm and dry.  Psychiatric: She has a normal mood and affect. Her behavior is normal.  Nursing note and vitals reviewed.   ED Course  Procedures (including critical care time) Labs Review Labs Reviewed  WET PREP, GENITAL - Abnormal; Notable for the following:    Clue Cells Wet Prep HPF POC FEW (*)    WBC, Wet Prep HPF POC MANY (*)    All other components within normal limits  URINALYSIS, ROUTINE W REFLEX MICROSCOPIC (NOT AT [facility]) - Abnormal; Notable for the following:    Color, Urine AMBER (*)    APPearance TURBID (*)    Bilirubin Urine SMALL (*)    Ketones, ur 15 (*)    Urobilinogen, UA 2.0 (*)    All other components within normal limits  URINE MICROSCOPIC-ADD ON - Abnormal; Notable for the following:    Squamous Epithelial / LPF FEW (*)    All other components within normal limits  RPR  HIV ANTIBODY (ROUTINE TESTING)  POC URINE PREG, ED  GC/CHLAMYDIA PROBE AMP ([place]) NOT AT [facility]    Imaging Review No results found. I have personally reviewed and evaluated these images and lab results as part of my medical decision-making.   EKG Interpretation None      MDM   Final diagnoses:  Vaginal discharge   Pt presenting for STD and pregnancy test. Pt c/o yellow vaginal discharge that began today. Denies abdominal pain, dysuria, pelvic pain, rashes or lesions. No known STD exposure. VSS. Pt nontoxic appearing. On pelvic exam, green/yellow discharge from cervix with CMT. Will treat as presumed GC & possible PID with 250 mg rocephin in ED and doxycycline 100 mg BID for 14 days. Pt does not meet inpatient treatment criteria. Return precautions given in discharge paperwork and discussed with pt. Informed pt she will be called with STD/HIV results if positive. Pt agrees with this plan    Alveta Heimlich, PA-C 11/26/14 2240  Cathren Laine, MD 11/29/14 1012

## 2014-11-26 NOTE — ED Notes (Signed)
PA at bedside.

## 2014-11-26 NOTE — Discharge Instructions (Signed)
-   Take Doxycycline twice a day for 14 days - Hospital will call you if your HIV, syphilis, gonorrhea or chlamydia comes back positive  - Return to ED with fevers, chills, pelvic pain, or further worsening of symptoms

## 2014-11-26 NOTE — ED Notes (Signed)
Pt refused to stay complete 30 mins post IM injection, pt educated.  Pt a x 4, NAD, VSS, ambulatory to discharge.

## 2014-11-27 LAB — HIV ANTIBODY (ROUTINE TESTING W REFLEX): HIV Screen 4th Generation wRfx: NONREACTIVE

## 2014-11-27 LAB — RPR: RPR Ser Ql: NONREACTIVE

## 2014-11-30 LAB — GC/CHLAMYDIA PROBE AMP (~~LOC~~) NOT AT ARMC
CHLAMYDIA, DNA PROBE: NEGATIVE
Neisseria Gonorrhea: NEGATIVE

## 2015-02-13 ENCOUNTER — Encounter (HOSPITAL_COMMUNITY): Payer: Self-pay | Admitting: Emergency Medicine

## 2015-02-13 ENCOUNTER — Emergency Department (HOSPITAL_COMMUNITY)
Admission: EM | Admit: 2015-02-13 | Discharge: 2015-02-13 | Disposition: A | Discharge status: Home / Self Care | Payer: Medicaid Other | Attending: Emergency Medicine | Admitting: Emergency Medicine

## 2015-02-13 DIAGNOSIS — L293 Anogenital pruritus, unspecified: Secondary | ICD-10-CM | POA: Diagnosis present

## 2015-02-13 DIAGNOSIS — Z91040 Latex allergy status: Secondary | ICD-10-CM | POA: Diagnosis not present

## 2015-02-13 DIAGNOSIS — F17200 Nicotine dependence, unspecified, uncomplicated: Secondary | ICD-10-CM | POA: Diagnosis not present

## 2015-02-13 DIAGNOSIS — N898 Other specified noninflammatory disorders of vagina: Secondary | ICD-10-CM

## 2015-02-13 DIAGNOSIS — N760 Acute vaginitis: Secondary | ICD-10-CM | POA: Insufficient documentation

## 2015-02-13 DIAGNOSIS — Z3202 Encounter for pregnancy test, result negative: Secondary | ICD-10-CM | POA: Insufficient documentation

## 2015-02-13 DIAGNOSIS — B9689 Other specified bacterial agents as the cause of diseases classified elsewhere: Secondary | ICD-10-CM

## 2015-02-13 LAB — URINALYSIS, ROUTINE W REFLEX MICROSCOPIC
BILIRUBIN URINE: NEGATIVE
Glucose, UA: NEGATIVE mg/dL
Hgb urine dipstick: NEGATIVE
Ketones, ur: NEGATIVE mg/dL
Leukocytes, UA: NEGATIVE
Nitrite: NEGATIVE
Protein, ur: NEGATIVE mg/dL
SPECIFIC GRAVITY, URINE: 1.023 (ref 1.005–1.030)
pH: 8 (ref 5.0–8.0)

## 2015-02-13 LAB — URINE MICROSCOPIC-ADD ON
RBC / HPF: NONE SEEN RBC/hpf (ref 0–5)
WBC, UA: NONE SEEN WBC/hpf (ref 0–5)

## 2015-02-13 LAB — WET PREP, GENITAL
Sperm: NONE SEEN
Trich, Wet Prep: NONE SEEN
Yeast Wet Prep HPF POC: NONE SEEN

## 2015-02-13 LAB — POC URINE PREG, ED
Preg Test, Ur: NEGATIVE
Preg Test, Ur: NEGATIVE

## 2015-02-13 MED ORDER — METRONIDAZOLE 500 MG PO TABS: 500.0000 mg | ORAL_TABLET | Freq: Two times a day (BID) | ORAL | Status: DC

## 2015-02-13 MED ORDER — METRONIDAZOLE 500 MG PO TABS
500.0000 mg | ORAL_TABLET | Freq: Once | ORAL | Status: AC
  Administered 2015-02-13: 500 mg via ORAL
  Filled 2015-02-13: qty 1

## 2015-02-13 NOTE — ED Notes (Addendum)
Pt reported having thick white mucus discharge and vaginal itching started 3 days ago. Pt wanted to be check to find out what it is.

## 2015-02-13 NOTE — ED Provider Notes (Signed)
CSN: [id number]     Arrival date & time 02/13/15  1715 History   First MD Initiated Contact with Patient 02/13/15 1740     Chief Complaint  Patient presents with  . Vaginal Itching  . Vaginal Discharge     (Consider location/radiation/quality/duration/timing/severity/associated sxs/prior Treatment) HPI   Julia Russell is a 24 y.o. female, patient with no significant past medical history, presenting to the ED with vaginal discharge that began three days ago and itching started today. Discharge is thick, white with "clumps" in it. Pt adds that she used latex condoms three days ago and today and said she is allergic to latex. Denies fever/chills, N/V, bleeding, abdominal pain, or any other complaints. Last menstrual period was at the beginning of November, but was only two days long.    Past Medical History  Diagnosis Date  . Abscess    History reviewed. No pertinent past surgical history. No family history on file. Social History  Substance Use Topics  . Smoking status: Current Every Day Smoker  . Smokeless tobacco: None  . Alcohol Use: Yes   OB History    No data available     Review of Systems  Genitourinary: Positive for vaginal discharge.       Vaginal itching      Allergies  Latex  Home Medications   Prior to Admission medications   Medication Sig Start Date End Date Taking? Authorizing Provider  metroNIDAZOLE (FLAGYL) 500 MG tablet Take 1 tablet (500 mg total) by mouth 2 (two) times daily. 02/13/15   Dayanne Yiu C Stelios Kirby, PA-C   BP 107/66 mmHg  Pulse 72  Temp(Src) 98.2 F (36.8 C) (Oral)  Resp 18  SpO2 100%  LMP 01/25/2015 Physical Exam  Constitutional: She appears well-developed and well-nourished. No distress.  HENT:  Head: Normocephalic and atraumatic.  Eyes: Conjunctivae are normal. Pupils are equal, round, and reactive to light.  Cardiovascular: Normal rate, regular rhythm and normal heart sounds.   Pulmonary/Chest: Effort normal and breath sounds  normal. No respiratory distress.  Abdominal: Soft. Bowel sounds are normal.  Genitourinary:  External genitalia normal Vagina with discharge - minimal white thick discharge Cervix  normal negative for cervical motion tenderness Adnexa palpated, no masses and negative for tenderness noted Bladder palpated negative for tenderness Uterus palpated no masses or negative for tenderness Med Tech served as chaperone during exam.  Musculoskeletal: She exhibits no edema or tenderness.  Neurological: She is alert.  Skin: Skin is warm and dry. She is not diaphoretic.  Nursing note and vitals reviewed.   ED Course  Pelvic exam Date/Time: 02/13/2015 6:20 PM Performed by: Anselm PancoastJOY, Adasia Hoar C Authorized by: Harolyn RutherfordJOY, Omari Koslosky C Consent: Verbal consent obtained. Risks and benefits: risks, benefits and alternatives were discussed Consent given by: patient Patient understanding: patient states understanding of the procedure being performed Patient consent: the patient's understanding of the procedure matches consent given Procedure consent: procedure consent matches procedure scheduled Required items: required blood products, implants, devices, and special equipment available Patient identity confirmed: verbally with patient and arm band Time out: Immediately prior to procedure a "time out" was called to verify the correct patient, procedure, equipment, support staff and site/side marked as required. Local anesthesia used: no Patient sedated: no Patient tolerance: Patient tolerated the procedure well with no immediate complications   (including critical care time) Labs Review Labs Reviewed  WET PREP, GENITAL - Abnormal; Notable for the following:    Clue Cells Wet Prep HPF POC PRESENT (*)  WBC, Wet Prep HPF POC MANY (*)    All other components within normal limits  RPR  HIV ANTIBODY (ROUTINE TESTING)  URINALYSIS, ROUTINE W REFLEX MICROSCOPIC (NOT AT [facility])  POC URINE PREG, ED  POC URINE PREG, ED   GC/CHLAMYDIA PROBE AMP ([place]) NOT AT [facility]    Imaging Review No results found. I have personally reviewed and evaluated these images and lab results as part of my medical decision-making.   EKG Interpretation None      MDM   Final diagnoses:  Vaginal discharge  Bacterial vaginosis    Julia Russell presents with vaginal discharge and itching.  Patient has evidence of bacterial vaginosis. STD labs are pending. Patient is been prescribed Flagyl and instructions for further care. Patient can follow-up with PCP as needed. Patient is also advised to stop using latex condoms and find an alternative source of contraception. Plan of care were shared with the patient along with the lab results, patient agreed to the plan.  Anselm Pancoast, PA-C 02/13/15 1950  Benjiman Core, MD 02/13/15 2330

## 2015-02-13 NOTE — ED Notes (Signed)
Pt c/o vaginal itching that started today. Pt states that she has had white "clumby discharge without odor over past couple days".

## 2015-02-13 NOTE — Discharge Instructions (Signed)
You have been seen today for vaginal discharge and itching. The labs reveal that you have bacterial vaginosis. You should take all the antibiotics as prescribed in their entirety. Follow up with PCP as needed. Return to ED should symptoms worsen.

## 2015-02-13 NOTE — ED Notes (Signed)
Awake. Verbally responsive. A/O x4. Resp even and unlabored. No audible adventitious breath sounds noted. ABC's intact. PA at bedside earlier with ED tech for pelvic exam.

## 2015-02-14 LAB — GC/CHLAMYDIA PROBE AMP (~~LOC~~) NOT AT ARMC
CHLAMYDIA, DNA PROBE: NEGATIVE
NEISSERIA GONORRHEA: POSITIVE — AB

## 2015-02-14 LAB — HIV ANTIBODY (ROUTINE TESTING W REFLEX): HIV SCREEN 4TH GENERATION: NONREACTIVE

## 2015-02-14 LAB — RPR: RPR Ser Ql: NONREACTIVE

## 2015-02-15 ENCOUNTER — Telehealth (HOSPITAL_BASED_OUTPATIENT_CLINIC_OR_DEPARTMENT_OTHER): Payer: Self-pay | Admitting: Emergency Medicine

## 2015-02-15 NOTE — Telephone Encounter (Signed)
Chart handoff to EDP for treatment plan for +GC 

## 2015-02-16 ENCOUNTER — Telehealth (HOSPITAL_COMMUNITY): Payer: Self-pay

## 2015-02-16 NOTE — Telephone Encounter (Signed)
Chart reviewed by Dr Judie PetitM. Gentry "Azithromycin 1 gram po x 1, Cefixime 400 mg po x 1" LVM requesting callback.

## 2015-02-18 ENCOUNTER — Telehealth (HOSPITAL_BASED_OUTPATIENT_CLINIC_OR_DEPARTMENT_OTHER): Payer: Self-pay | Admitting: Emergency Medicine

## 2015-02-19 ENCOUNTER — Telehealth (HOSPITAL_BASED_OUTPATIENT_CLINIC_OR_DEPARTMENT_OTHER): Payer: Self-pay | Admitting: Emergency Medicine

## 2015-02-19 NOTE — Telephone Encounter (Signed)
Post ED Visit - Positive Culture Follow-up: Successful Patient Follow-Up  Culture assessed and recommendations reviewed by: []  Enzo BiNathan Batchelder, Pharm.D. []  Celedonio MiyamotoJeremy Frens, Pharm. ., BCPS []  Garvin FilaMike Maccia, Pharm.D. []  Georgina PillionElizabeth Martin, Pharm.D., BCPS []  WeatherfordMinh Pham, [place].D., BCPS, AAHIVP []  Estella HuskMichelle Turner, Pharm.D., BCPS, AAHIVP []  Tennis Mustassie Stewart, Pharm.D. []  Sherle Poeob Vincent, [place]D.  Positive GC culture  [x]  Patient discharged without antimicrobial prescription and treatment is now indicated []  Organism is resistant to prescribed ED discharge antimicrobial []  Patient with positive blood cultures  Changes discussed with ED provider: Mirian MoMatthew Gentry MD New antibiotic prescription Azithromycin 1 gram po x 1 and cefixime 400mg  po x [facility] [address]  02/19/15 1028   Julia Russell, Julia Russell 02/19/2015, 10:27 AM

## 2015-03-27 NOTE — L&D Delivery Note (Signed)
Delivery Note At 3:01 AM a viable female was delivered via  (Presentation: vertex ; direct OA  ).  APGAR: 9, 9; weight pending  .   Placenta status: delivered intact with gentle traction  Cord: 3 vessle with the following complications: none.  Cord pH: not obtained  Anesthesia:  none Episiotomy:  none Lacerations:   none Est. Blood Loss (mL):  100  Mom to postpartum.  Baby to Couplet care / Skin to Skin.  Ernestina Pennaicholas Millette Halberstam 02/26/2016, 3:07 AM

## 2015-05-01 ENCOUNTER — Emergency Department (HOSPITAL_COMMUNITY)
Admission: EM | Admit: 2015-05-01 | Discharge: 2015-05-02 | Disposition: A | Discharge status: Home / Self Care | Payer: Medicaid Other | Attending: Emergency Medicine | Admitting: Emergency Medicine

## 2015-05-01 ENCOUNTER — Encounter (HOSPITAL_COMMUNITY): Payer: Self-pay | Admitting: *Deleted

## 2015-05-01 DIAGNOSIS — Z91040 Latex allergy status: Secondary | ICD-10-CM | POA: Diagnosis not present

## 2015-05-01 DIAGNOSIS — F17200 Nicotine dependence, unspecified, uncomplicated: Secondary | ICD-10-CM | POA: Diagnosis not present

## 2015-05-01 DIAGNOSIS — I10 Essential (primary) hypertension: Secondary | ICD-10-CM | POA: Diagnosis not present

## 2015-05-01 DIAGNOSIS — Z872 Personal history of diseases of the skin and subcutaneous tissue: Secondary | ICD-10-CM | POA: Diagnosis not present

## 2015-05-01 DIAGNOSIS — Y92410 Unspecified street and highway as the place of occurrence of the external cause: Secondary | ICD-10-CM | POA: Diagnosis not present

## 2015-05-01 DIAGNOSIS — S8991XA Unspecified injury of right lower leg, initial encounter: Secondary | ICD-10-CM | POA: Diagnosis present

## 2015-05-01 DIAGNOSIS — M25561 Pain in right knee: Secondary | ICD-10-CM

## 2015-05-01 DIAGNOSIS — Y9389 Activity, other specified: Secondary | ICD-10-CM | POA: Diagnosis not present

## 2015-05-01 DIAGNOSIS — Y998 Other external cause status: Secondary | ICD-10-CM | POA: Diagnosis not present

## 2015-05-01 NOTE — ED Notes (Signed)
Pt ambulated to treatment room
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
## 2015-05-02 ENCOUNTER — Emergency Department (HOSPITAL_COMMUNITY): Payer: Medicaid Other

## 2015-05-02 MED ORDER — IBUPROFEN 800 MG PO TABS
800.0000 mg | ORAL_TABLET | Freq: Once | ORAL | Status: AC
  Administered 2015-05-02: 800 mg via ORAL
  Filled 2015-05-02: qty 1

## 2015-05-02 MED ORDER — METHOCARBAMOL 500 MG PO TABS
750.0000 mg | ORAL_TABLET | Freq: Once | ORAL | Status: AC
  Administered 2015-05-02: 750 mg via ORAL
  Filled 2015-05-02: qty 2

## 2015-05-02 MED ORDER — METHOCARBAMOL 750 MG PO TABS: 750.0000 mg | ORAL_TABLET | Freq: Three times a day (TID) | ORAL | Status: DC | PRN

## 2015-05-02 MED ORDER — IBUPROFEN 800 MG PO TABS: 800.0000 mg | ORAL_TABLET | Freq: Three times a day (TID) | ORAL | Status: DC
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
## 2015-07-18 ENCOUNTER — Other Ambulatory Visit: Payer: Self-pay

## 2015-07-18 DIAGNOSIS — Z1231 Encounter for screening mammogram for malignant neoplasm of breast: Secondary | ICD-10-CM

## 2015-07-20 ENCOUNTER — Other Ambulatory Visit: Payer: Self-pay | Admitting: Obstetrics

## 2015-07-20 DIAGNOSIS — N63 Unspecified lump in unspecified breast: Secondary | ICD-10-CM

## 2015-07-27 ENCOUNTER — Ambulatory Visit
Admission: RE | Admit: 2015-07-27 | Discharge: 2015-07-27 | Disposition: A | Discharge status: Home / Self Care | Payer: Medicaid Other | Source: Ambulatory Visit | Attending: Obstetrics | Admitting: Obstetrics

## 2015-07-27 DIAGNOSIS — N63 Unspecified lump in unspecified breast: Secondary | ICD-10-CM

## 2015-08-04 ENCOUNTER — Encounter (HOSPITAL_COMMUNITY): Payer: Self-pay

## 2015-08-04 ENCOUNTER — Inpatient Hospital Stay (HOSPITAL_COMMUNITY)
Admission: AD | Admit: 2015-08-04 | Discharge: 2015-08-04 | Disposition: A | Discharge status: Home / Self Care | Payer: Medicaid Other | Source: Ambulatory Visit | Attending: Obstetrics | Admitting: Obstetrics

## 2015-08-04 ENCOUNTER — Inpatient Hospital Stay (HOSPITAL_COMMUNITY): Payer: Medicaid Other

## 2015-08-04 DIAGNOSIS — I10 Essential (primary) hypertension: Secondary | ICD-10-CM | POA: Diagnosis not present

## 2015-08-04 DIAGNOSIS — O99331 Smoking (tobacco) complicating pregnancy, first trimester: Secondary | ICD-10-CM | POA: Insufficient documentation

## 2015-08-04 DIAGNOSIS — Z3A01 Less than 8 weeks gestation of pregnancy: Secondary | ICD-10-CM | POA: Diagnosis not present

## 2015-08-04 DIAGNOSIS — O4691 Antepartum hemorrhage, unspecified, first trimester: Secondary | ICD-10-CM | POA: Diagnosis not present

## 2015-08-04 DIAGNOSIS — Z91040 Latex allergy status: Secondary | ICD-10-CM | POA: Insufficient documentation

## 2015-08-04 DIAGNOSIS — O209 Hemorrhage in early pregnancy, unspecified: Secondary | ICD-10-CM | POA: Diagnosis not present

## 2015-08-04 DIAGNOSIS — Z3490 Encounter for supervision of normal pregnancy, unspecified, unspecified trimester: Secondary | ICD-10-CM

## 2015-08-04 LAB — URINE MICROSCOPIC-ADD ON

## 2015-08-04 LAB — WET PREP, GENITAL
Sperm: NONE SEEN
Trich, Wet Prep: NONE SEEN
YEAST WET PREP: NONE SEEN

## 2015-08-04 LAB — HIV ANTIBODY (ROUTINE TESTING W REFLEX): HIV Screen 4th Generation wRfx: NONREACTIVE

## 2015-08-04 LAB — URINALYSIS, ROUTINE W REFLEX MICROSCOPIC
BILIRUBIN URINE: NEGATIVE
Glucose, UA: NEGATIVE mg/dL
Ketones, ur: NEGATIVE mg/dL
Leukocytes, UA: NEGATIVE
NITRITE: NEGATIVE
PROTEIN: NEGATIVE mg/dL
SPECIFIC GRAVITY, URINE: 1.02 (ref 1.005–1.030)
pH: 6.5 (ref 5.0–8.0)

## 2015-08-04 LAB — CBC
HCT: 36.2 % (ref 36.0–46.0)
Hemoglobin: 12.2 g/dL (ref 12.0–15.0)
MCH: 29.8 pg (ref 26.0–34.0)
MCHC: 33.7 g/dL (ref 30.0–36.0)
MCV: 88.3 fL (ref 78.0–100.0)
PLATELETS: 251 10*3/uL (ref 150–400)
RBC: 4.1 MIL/uL (ref 3.87–5.11)
RDW: 12.7 % (ref 11.5–15.5)
WBC: 7.2 10*3/uL (ref 4.0–10.5)

## 2015-08-04 LAB — RPR: RPR Ser Ql: NONREACTIVE

## 2015-08-04 LAB — POCT PREGNANCY, URINE: PREG TEST UR: POSITIVE — AB

## 2015-08-04 LAB — HCG, QUANTITATIVE, PREGNANCY: HCG, BETA CHAIN, QUANT, S: 28951 m[IU]/mL — AB (ref <5)

## 2015-08-04 LAB — ABO/RH: ABO/RH(D): B POS

## 2015-08-04 MED ORDER — PROMETHAZINE HCL 25 MG PO TABS: 12.5000 mg | ORAL_TABLET | Freq: Four times a day (QID) | ORAL | Status: DC | PRN

## 2015-08-04 NOTE — Discharge Instructions (Signed)
First Trimester of Pregnancy The first trimester of pregnancy is from week 1 until the end of week 12 (months 1 through 3). A week after a sperm fertilizes an egg, the egg will implant on the wall of the uterus. This embryo will begin to develop into a baby. Genes from you and your partner are forming the baby. The female genes determine whether the baby is a boy or a girl. At 6-8 weeks, the eyes and face are formed, and the heartbeat can be seen on ultrasound. At the end of 12 weeks, all the baby's organs are formed.  Now that you are pregnant, you will want to do everything you can to have a healthy baby. Two of the most important things are to get good prenatal care and to follow your health care provider's instructions. Prenatal care is all the medical care you receive before the baby's birth. This care will help prevent, find, and treat any problems during the pregnancy and childbirth. BODY CHANGES Your body goes through many changes during pregnancy. The changes vary from woman to woman.   You may gain or lose a couple of pounds at first.  You may feel sick to your stomach (nauseous) and throw up (vomit). If the vomiting is uncontrollable, call your health care provider.  You may tire easily.  You may develop headaches that can be relieved by medicines approved by your health care provider.  You may urinate more often. Painful urination may mean you have a bladder infection.  You may develop heartburn as a result of your pregnancy.  You may develop constipation because certain hormones are causing the muscles that push waste through your intestines to slow down.  You may develop hemorrhoids or swollen, bulging veins (varicose veins).  Your breasts may begin to grow larger and become tender. Your nipples may stick out more, and the tissue that surrounds them (areola) may become darker.  Your gums may bleed and may be sensitive to brushing and flossing.  Dark spots or blotches (chloasma,  mask of pregnancy) may develop on your face. This will likely fade after the baby is born.  Your menstrual periods will stop.  You may have a loss of appetite.  You may develop cravings for certain kinds of food.  You may have changes in your emotions from day to day, such as being excited to be pregnant or being concerned that something may go wrong with the pregnancy and baby.  You may have more vivid and strange dreams.  You may have changes in your hair. These can include thickening of your hair, rapid growth, and changes in texture. Some women also have hair loss during or after pregnancy, or hair that feels dry or thin. Your hair will most likely return to normal after your baby is born. WHAT TO EXPECT AT YOUR PRENATAL VISITS During a routine prenatal visit:  You will be weighed to make sure you and the baby are growing normally.  Your blood pressure will be taken.  Your abdomen will be measured to track your baby's growth.  The fetal heartbeat will be listened to starting around week 10 or 12 of your pregnancy.  Test results from any previous visits will be discussed. Your health care provider may ask you:  How you are feeling.  If you are feeling the baby move.  If you have had any abnormal symptoms, such as leaking fluid, bleeding, severe headaches, or abdominal cramping.  If you are using any tobacco products,   including cigarettes, chewing tobacco, and electronic cigarettes.  If you have any questions. Other tests that may be performed during your first trimester include:  Blood tests to find your blood type and to check for the presence of any previous infections. They will also be used to check for low iron levels (anemia) and Rh antibodies. Later in the pregnancy, blood tests for diabetes will be done along with other tests if problems develop.  Urine tests to check for infections, diabetes, or protein in the urine.  An ultrasound to confirm the proper growth  and development of the baby.  An amniocentesis to check for possible genetic problems.  Fetal screens for spina bifida and Down syndrome.  You may need other tests to make sure you and the baby are doing well.  HIV (human immunodeficiency virus) testing. Routine prenatal testing includes screening for HIV, unless you choose not to have this test. HOME CARE INSTRUCTIONS  Medicines  Follow your health care provider's instructions regarding medicine use. Specific medicines may be either safe or unsafe to take during pregnancy.  Take your prenatal vitamins as directed.  If you develop constipation, try taking a stool softener if your health care provider approves. Diet  Eat regular, well-balanced meals. Choose a variety of foods, such as meat or vegetable-based protein, fish, milk and low-fat dairy products, vegetables, fruits, and whole grain breads and cereals. Your health care provider will help you determine the amount of weight gain that is right for you.  Avoid raw meat and uncooked cheese. These carry germs that can cause birth defects in the baby.  Eating four or five small meals rather than three large meals a day may help relieve nausea and vomiting. If you start to feel nauseous, eating a few soda crackers can be helpful. Drinking liquids between meals instead of during meals also seems to help nausea and vomiting.  If you develop constipation, eat more high-fiber foods, such as fresh vegetables or fruit and whole grains. Drink enough fluids to keep your urine clear or pale yellow. Activity and Exercise  Exercise only as directed by your health care provider. Exercising will help you:  Control your weight.  Stay in shape.  Be prepared for labor and delivery.  Experiencing pain or cramping in the lower abdomen or low back is a good sign that you should stop exercising. Check with your health care provider before continuing normal exercises.  Try to avoid standing for long  periods of time. Move your legs often if you must stand in one place for a long time.  Avoid heavy lifting.  Wear low-heeled shoes, and practice good posture.  You may continue to have sex unless your health care provider directs you otherwise. Relief of Pain or Discomfort  Wear a good support bra for breast tenderness.   Take warm sitz baths to soothe any pain or discomfort caused by hemorrhoids. Use hemorrhoid cream if your health care provider approves.   Rest with your legs elevated if you have leg cramps or low back pain.  If you develop varicose veins in your legs, wear support hose. Elevate your feet for 15 minutes, 3-4 times a day. Limit salt in your diet. Prenatal Care  Schedule your prenatal visits by the twelfth week of pregnancy. They are usually scheduled monthly at first, then more often in the last 2 months before delivery.  Write down your questions. Take them to your prenatal visits.  Keep all your prenatal visits as directed by your   health care provider. Safety  Wear your seat belt at all times when driving.  Make a list of emergency phone numbers, including numbers for family, friends, the hospital, and police and fire departments. General Tips  Ask your health care provider for a referral to a local prenatal education class. Begin classes no later than at the beginning of month 6 of your pregnancy.  Ask for help if you have counseling or nutritional needs during pregnancy. Your health care provider can offer advice or refer you to specialists for help with various needs.  Do not use hot tubs, steam rooms, or saunas.  Do not douche or use tampons or scented sanitary pads.  Do not cross your legs for long periods of time.  Avoid cat litter boxes and soil used by cats. These carry germs that can cause birth defects in the baby and possibly loss of the fetus by miscarriage or stillbirth.  Avoid all smoking, herbs, alcohol, and medicines not prescribed by  your health care provider. Chemicals in these affect the formation and growth of the baby.  Do not use any tobacco products, including cigarettes, chewing tobacco, and electronic cigarettes. If you need help quitting, ask your health care provider. You may receive counseling support and other resources to help you quit.  Schedule a dentist appointment. At home, brush your teeth with a soft toothbrush and be gentle when you floss. SEEK MEDICAL CARE IF:   You have dizziness.  You have mild pelvic cramps, pelvic pressure, or nagging pain in the abdominal area.  You have persistent nausea, vomiting, or diarrhea.  You have a bad smelling vaginal discharge.  You have pain with urination.  You notice increased swelling in your face, hands, legs, or ankles. SEEK IMMEDIATE MEDICAL CARE IF:   You have a fever.  You are leaking fluid from your vagina.  You have spotting or bleeding from your vagina.  You have severe abdominal cramping or pain.  You have rapid weight gain or loss.  You vomit blood or material that looks like coffee grounds.  You are exposed to German measles and have never had them.  You are exposed to fifth disease or chickenpox.  You develop a severe headache.  You have shortness of breath.  You have any kind of trauma, such as from a fall or a car accident.   This information is not intended to replace advice given to you by your health care provider. Make sure you discuss any questions you have with your health care provider.   Document Released: 03/06/2001 Document Revised: 04/02/2014 Document Reviewed: 01/20/2013 Elsevier Interactive Patient Education 2016 [employer]  

## 2015-08-04 NOTE — MAU Note (Signed)
Patient states that when she went to the bathroom a couple of hours ago she saw blood when she wiped. Patient has not worn a pad since and says when she went to the bathroom it was light pink when she wiped.

## 2015-08-04 NOTE — MAU Provider Note (Signed)
History     CSN: [id number]  Arrival date and time: 08/04/15 0021   First Provider Initiated Contact with Patient 08/04/15 0050      Chief Complaint  Patient presents with  . Vaginal Bleeding   Vaginal Bleeding The patient's primary symptoms include vaginal bleeding. This is a new problem. The current episode started today. The problem occurs intermittently. The problem has been unchanged. The patient is experiencing no pain. She is pregnant. Associated symptoms include nausea and vomiting. Pertinent negatives include no abdominal pain, chills, constipation, diarrhea, dysuria, fever, frequency or urgency. The vaginal discharge was bloody. The vaginal bleeding is spotting (pink. ). She has not been passing clots. She has not been passing tissue. Nothing aggravates the symptoms. She has tried nothing for the symptoms. Sexual activity: not currently sexually active  It is unknown whether or not her partner has an STD. She uses nothing for contraception. Her menstrual history has been regular (LMP 06/18/15 ).    Past Medical History  Diagnosis Date  . Abscess   . Hypertension     History reviewed. No pertinent past surgical history.  History reviewed. No pertinent family history.  Social History  Substance Use Topics  . Smoking status: Current Every Day Smoker  . Smokeless tobacco: None  . Alcohol Use: Yes    Allergies:  Allergies  Allergen Reactions  . Latex Itching and Rash    Prescriptions prior to admission  Medication Sig Dispense Refill Last Dose  . ibuprofen (ADVIL,MOTRIN) 800 MG tablet Take 1 tablet (800 mg total) by mouth 3 (three) times daily. 30 tablet 0   . methocarbamol (ROBAXIN) 750 MG tablet Take 1 tablet (750 mg total) by mouth every 8 (eight) hours as needed for muscle spasms. 21 tablet 0   . metroNIDAZOLE (FLAGYL) 500 MG tablet Take 1 tablet (500 mg total) by mouth 2 (two) times daily. (Patient not taking: Reported on 05/02/2015) 14 tablet 0     Review of  Systems  Constitutional: Negative for fever and chills.  Gastrointestinal: Positive for nausea and vomiting. Negative for abdominal pain, diarrhea and constipation.  Genitourinary: Positive for vaginal bleeding. Negative for dysuria, urgency and frequency.   Physical Exam   Blood pressure 113/66, pulse 81, temperature 98.9 F (37.2 C), temperature source Oral, resp. rate 18, last menstrual period 06/21/2015.  Physical Exam  Nursing note and vitals reviewed. Constitutional: She is oriented to person, place, and time. She appears well-developed and well-nourished. No distress.  HENT:  Head: Normocephalic.  Cardiovascular: Normal rate.   GI: There is no tenderness. There is no rebound.  Genitourinary:   External: no lesion Vagina: small amount of pink discharge Cervix: pink, smooth, no CMT Uterus: NSSC Adnexa: NT   Neurological: She is alert and oriented to person, place, and time.  Skin: Skin is warm and dry.  Psychiatric: She has a normal mood and affect.   Results for orders placed or performed during the hospital encounter of 08/04/15 (from the past 24 hour(s))  Urinalysis, Routine w reflex microscopic (not at [facility])     Status: Abnormal   Collection Time: 08/04/15 12:32 AM  Result Value Ref Range   Color, Urine YELLOW YELLOW   APPearance CLEAR CLEAR   Specific Gravity, Urine 1.020 1.005 - 1.030   pH 6.5 5.0 - 8.0   Glucose, UA NEGATIVE NEGATIVE mg/dL   Hgb urine dipstick LARGE (A) NEGATIVE   Bilirubin Urine NEGATIVE NEGATIVE   Ketones, ur NEGATIVE NEGATIVE mg/dL   Protein, ur  NEGATIVE NEGATIVE mg/dL   Nitrite NEGATIVE NEGATIVE   Leukocytes, UA NEGATIVE NEGATIVE  Urine microscopic-add on     Status: Abnormal   Collection Time: 08/04/15 12:32 AM  Result Value Ref Range   Squamous Epithelial / LPF 0-5 (A) NONE SEEN   WBC, UA 0-5 0 - 5 WBC/hpf   RBC / HPF 6-30 0 - 5 RBC/hpf   Bacteria, UA FEW (A) NONE SEEN   Urine-Other AMORPHOUS URATES/PHOSPHATES   Pregnancy, urine  POC     Status: Abnormal   Collection Time: 08/04/15 12:40 AM  Result Value Ref Range   Preg Test, Ur POSITIVE (A) NEGATIVE  CBC     Status: None   Collection Time: 08/04/15 12:57 AM  Result Value Ref Range   WBC 7.2 4.0 - 10.5 K/uL   RBC 4.10 3.87 - 5.11 MIL/uL   Hemoglobin 12.2 12.0 - 15.0 g/dL   HCT 54.036.2 98.136.0 - 19.146.0 %   MCV 88.3 78.0 - 100.0 fL   MCH 29.8 26.0 - 34.0 pg   MCHC 33.7 30.0 - 36.0 g/dL   RDW 47.812.7 29.511.5 - 62.115.5 %   Platelets 251 150 - 400 K/uL  hCG, quantitative, pregnancy     Status: Abnormal   Collection Time: 08/04/15 12:57 AM  Result Value Ref Range   hCG, Beta Chain, Quant, S [id number] (H) <5 mIU/mL  ABO/Rh     Status: None (Preliminary result)   Collection Time: 08/04/15 12:57 AM  Result Value Ref Range   ABO/RH(D) B POS   Wet prep, genital     Status: Abnormal   Collection Time: 08/04/15  1:23 AM  Result Value Ref Range   Yeast Wet Prep HPF POC NONE SEEN NONE SEEN   Trich, Wet Prep NONE SEEN NONE SEEN   Clue Cells Wet Prep HPF POC PRESENT (A) NONE SEEN   WBC, Wet Prep HPF POC MODERATE (A) NONE SEEN   Sperm NONE SEEN    [place] Ob Comp Less 14 Wks  08/04/2015  CLINICAL DATA:  Vaginal bleeding. EXAM: OBSTETRIC <14 WK US AND TRANSVAGINAL OB US TECHNIQUE: Both transabdominal and transvaginal ultrasound examinations were performed for complete evaluation of the gestation as well as the maternal uterus, adnexal regions, and pelvic cul-de-sac. Transvaginal technique was performed to assess early pregnancy. COMPARISON:  None. FINDINGS: Intrauterine gestational sac: Single Yolk sac:  Yes Embryo:  Yes Cardiac Activity: Yes Heart Rate: 116  bpm MSD:   mm    w     d CRL:  6.3  mm   6 w   3 d                  US EDC: 03/26/2016 Subchorionic hemorrhage: Go 1.2 x 1.8 x 2.0 cm subchorionic hemorrhage. Maternal uterus/adnexae: Normal ovaries.  No abnormal pelvic fluid. IMPRESSION: Single living intrauterine gestation measuring 6 weeks 3 days by crown-rump length. Small subchorionic  hemorrhage. Electronically Signed   By: Ellery Plunkaniel R Mitchell M.D.   On: 08/04/2015 02:26   [place] Ob Transvaginal  08/04/2015  CLINICAL DATA:  Vaginal bleeding. EXAM: OBSTETRIC <14 WK US AND TRANSVAGINAL OB US TECHNIQUE: Both transabdominal and transvaginal ultrasound examinations were performed for complete evaluation of the gestation as well as the maternal uterus, adnexal regions, and pelvic cul-de-sac. Transvaginal technique was performed to assess early pregnancy. COMPARISON:  None. FINDINGS: Intrauterine gestational sac: Single Yolk sac:  Yes Embryo:  Yes Cardiac Activity: Yes Heart Rate: 116  bpm MSD:   mm  w     d CRL:  6.3  mm   6 w   3 d                  [place] EDC: 03/26/2016 Subchorionic hemorrhage: Go 1.2 x 1.8 x 2.0 cm subchorionic hemorrhage. Maternal uterus/adnexae: Normal ovaries.  No abnormal pelvic fluid. IMPRESSION: Single living intrauterine gestation measuring 6 weeks 3 days by crown-rump length. Small subchorionic hemorrhage. Electronically Signed   By: Ellery Plunk M.D.   On: 08/04/2015 02:26    MAU Course  Procedures  MDM   Assessment and Plan   1. [redacted] weeks gestation of pregnancy   2. Vaginal bleeding in pregnancy, first trimester   3. Intrauterine pregnancy    DC home Comfort measures reviewed  1st Trimester precautions  RX: phenergan PRN #[id number]  Return to MAU as needed FU with OB as planned  Follow-up Information    Follow up with Brock Bad, MD.   Specialty:  Obstetrics and Gynecology   Why:  As scheduled   Contact information:   [address] [place] [address] [phone]         Tawnya Crook 08/04/2015, 12:54 AM

## 2015-08-05 LAB — GC/CHLAMYDIA PROBE AMP (~~LOC~~) NOT AT ARMC
Chlamydia: NEGATIVE
NEISSERIA GONORRHEA: NEGATIVE

## 2015-09-01 ENCOUNTER — Encounter: Payer: Self-pay | Admitting: Obstetrics

## 2015-09-01 ENCOUNTER — Telehealth: Payer: Self-pay

## 2015-09-01 ENCOUNTER — Ambulatory Visit (INDEPENDENT_AMBULATORY_CARE_PROVIDER_SITE_OTHER): Payer: Medicaid Other | Admitting: Obstetrics

## 2015-09-01 VITALS — BP 104/64 | HR 86 | Temp 97.7°F | Wt 149.0 lb

## 2015-09-01 DIAGNOSIS — O09211 Supervision of pregnancy with history of pre-term labor, first trimester: Secondary | ICD-10-CM | POA: Diagnosis not present

## 2015-09-01 DIAGNOSIS — Z3491 Encounter for supervision of normal pregnancy, unspecified, first trimester: Secondary | ICD-10-CM

## 2015-09-01 DIAGNOSIS — Z1389 Encounter for screening for other disorder: Secondary | ICD-10-CM | POA: Diagnosis not present

## 2015-09-01 DIAGNOSIS — Z331 Pregnant state, incidental: Secondary | ICD-10-CM | POA: Diagnosis not present

## 2015-09-01 LAB — POCT URINALYSIS DIPSTICK
Bilirubin, UA: NEGATIVE
Glucose, UA: NEGATIVE
Ketones, UA: NEGATIVE
Leukocytes, UA: NEGATIVE
Nitrite, UA: NEGATIVE
PH UA: 6
RBC UA: NEGATIVE
SPEC GRAV UA: 1.015
UROBILINOGEN UA: NEGATIVE

## 2015-09-01 MED ORDER — VITAFOL GUMMIES 3.33-0.333-34.8 MG PO CHEW: 3.0000 | CHEWABLE_TABLET | Freq: Every day | ORAL | Status: DC

## 2015-09-01 NOTE — Progress Notes (Signed)
Patient reports some lower pressure- not constant. Some back pain.

## 2015-09-01 NOTE — Progress Notes (Signed)
  Subjective:    Julia Russell is a 25 y.o. female being seen today for her obstetrical visit. She is at 3135w2d gestation. Patient reports: no complaints.  Problem List Items Addressed This Visit    None    Visit Diagnoses    Prenatal care, first trimester    -  Primary    Relevant Medications    Prenatal Vit-Fe Phos-FA-Omega (VITAFOL GUMMIES) 3.33-0.333-34.8 MG CHEW    Other Relevant Orders    HIV antibody    Hemoglobinopathy evaluation    VITAMIN D 25 Hydroxy (Vit-D Deficiency, Fractures)    Culture, OB Urine    POCT urinalysis dipstick (Completed)    NuSwab VG+, Candida 6sp    Prenatal Profile I    Pap IG w/ reflex to HPV when ASC-U    Previous preterm delivery, antepartum, first trimester        Relevant Orders    AMB referral to maternal fetal medicine    US MFM OB DETAIL +14 WK    US MFM OB Transvaginal      There are no active problems to display for this patient.   Objective:     BP 104/64 mmHg  Pulse 86  Temp(Src) 97.7 F (36.5 C)  Wt 149 lb (67.586 kg)  LMP 06/21/2015 Uterine Size: Below umbilicus     Assessment:    Pregnancy @ 2735w2d  weeks  Previous preterm deliveries  Plan:    Referred to MFM for H/O preterm deliveries.  Problem list reviewed and updated. Labs reviewed.  Follow up in 2 weeks. FIRST/CF mutation testing/NIPT/QUAD SCREEN/fragile X/Ashkenazi Jewish population testing/Spinal muscular atrophy discussed: requested. Role of ultrasound in pregnancy discussed; fetal survey: requested. Amniocentesis discussed: not indicated.

## 2015-09-01 NOTE — Telephone Encounter (Signed)
Does she need a fetal nuchal translucency before the anatomy? Will need an order if so thanks!

## 2015-09-02 LAB — PRENATAL PROFILE I(LABCORP)
Antibody Screen: NEGATIVE
BASOS: 0 %
Basophils Absolute: 0 10*3/uL (ref 0.0–0.2)
EOS (ABSOLUTE): 0 10*3/uL (ref 0.0–0.4)
EOS: 0 %
HEMATOCRIT: 36.5 % (ref 34.0–46.6)
Hemoglobin: 12.2 g/dL (ref 11.1–15.9)
Hepatitis B Surface Ag: NEGATIVE
Immature Grans (Abs): 0 10*3/uL (ref 0.0–0.1)
Immature Granulocytes: 0 %
LYMPHS ABS: 2.4 10*3/uL (ref 0.7–3.1)
Lymphs: 32 %
MCH: 30 pg (ref 26.6–33.0)
MCHC: 33.4 g/dL (ref 31.5–35.7)
MCV: 90 fL (ref 79–97)
MONOCYTES: 11 %
Monocytes Absolute: 0.8 10*3/uL (ref 0.1–0.9)
NEUTROS ABS: 4.2 10*3/uL (ref 1.4–7.0)
Neutrophils: 57 %
Platelets: 256 10*3/uL (ref 150–379)
RBC: 4.06 x10E6/uL (ref 3.77–5.28)
RDW: 13.4 % (ref 12.3–15.4)
RH TYPE: POSITIVE
RPR: NONREACTIVE
Rubella Antibodies, IGG: 1.61 index (ref >0.99)
WBC: 7.4 10*3/uL (ref 3.4–10.8)

## 2015-09-02 LAB — HIV ANTIBODY (ROUTINE TESTING W REFLEX): HIV SCREEN 4TH GENERATION: NONREACTIVE

## 2015-09-02 LAB — HEMOGLOBINOPATHY EVALUATION
HGB A: 97.8 % (ref 94.0–98.0)
HGB C: 0 %
HGB S: 0 %
Hemoglobin A2 Quantitation: 2.2 % (ref 0.7–3.1)
Hemoglobin F Quantitation: 0 % (ref 0.0–2.0)

## 2015-09-02 LAB — VITAMIN D 25 HYDROXY (VIT D DEFICIENCY, FRACTURES): Vit D, 25-Hydroxy: 11.6 ng/mL — ABNORMAL LOW (ref 30.0–100.0)

## 2015-09-03 LAB — CULTURE, OB URINE

## 2015-09-03 LAB — URINE CULTURE, OB REFLEX

## 2015-09-04 LAB — PAP IG W/ RFLX HPV ASCU: PAP SMEAR COMMENT: 0

## 2015-09-05 ENCOUNTER — Encounter (HOSPITAL_COMMUNITY): Payer: Self-pay | Admitting: Obstetrics

## 2015-09-05 NOTE — Telephone Encounter (Signed)
Please advise 

## 2015-09-06 LAB — NUSWAB VG+, CANDIDA 6SP
ATOPOBIUM VAGINAE: HIGH {score} — AB
BVAB 2: HIGH {score} — AB
CANDIDA GLABRATA, NAA: NEGATIVE
CANDIDA KRUSEI, NAA: NEGATIVE
Candida albicans, NAA: NEGATIVE
Candida lusitaniae, NAA: NEGATIVE
Candida parapsilosis, NAA: NEGATIVE
Candida tropicalis, NAA: NEGATIVE
Chlamydia trachomatis, NAA: NEGATIVE
Megasphaera 1: HIGH Score — AB
NEISSERIA GONORRHOEAE, NAA: NEGATIVE
TRICH VAG BY NAA: NEGATIVE

## 2015-09-07 ENCOUNTER — Encounter: Payer: Self-pay | Admitting: Obstetrics

## 2015-09-08 ENCOUNTER — Telehealth: Payer: Self-pay | Admitting: *Deleted

## 2015-09-08 DIAGNOSIS — N760 Acute vaginitis: Principal | ICD-10-CM

## 2015-09-08 DIAGNOSIS — B9689 Other specified bacterial agents as the cause of diseases classified elsewhere: Secondary | ICD-10-CM

## 2015-09-08 MED ORDER — CLINDAMYCIN HCL 300 MG PO CAPS: 300.0000 mg | ORAL_CAPSULE | Freq: Three times a day (TID) | ORAL | Status: DC

## 2015-09-08 NOTE — Telephone Encounter (Signed)
Patient is 11 weeks and has a discharge. 6/15 10:45 Call to patient- LM on VM- will send Rx to her pharmacy to treat BV.

## 2015-09-12 ENCOUNTER — Other Ambulatory Visit: Payer: Self-pay | Admitting: Obstetrics

## 2015-09-13 ENCOUNTER — Encounter (HOSPITAL_COMMUNITY): Payer: Self-pay | Admitting: Emergency Medicine

## 2015-09-13 ENCOUNTER — Emergency Department (HOSPITAL_COMMUNITY)
Admission: EM | Admit: 2015-09-13 | Discharge: 2015-09-13 | Disposition: A | Discharge status: Home / Self Care | Payer: Medicaid Other | Attending: Emergency Medicine | Admitting: Emergency Medicine

## 2015-09-13 DIAGNOSIS — Z87891 Personal history of nicotine dependence: Secondary | ICD-10-CM | POA: Diagnosis not present

## 2015-09-13 DIAGNOSIS — Z5321 Procedure and treatment not carried out due to patient leaving prior to being seen by health care provider: Secondary | ICD-10-CM | POA: Diagnosis not present

## 2015-09-13 DIAGNOSIS — L0291 Cutaneous abscess, unspecified: Secondary | ICD-10-CM | POA: Insufficient documentation

## 2015-09-13 NOTE — ED Notes (Signed)
Pt is 3  months pregnant.  

## 2015-09-13 NOTE — ED Notes (Addendum)
Patient came up to Nurse First desk reporting she does not want to wait and has a family member already being seen as patient. She does not want to wait. Pt is alert, oriented x 4, and appears in no distress.

## 2015-09-13 NOTE — ED Notes (Signed)
Pt noted some swelling under her left arm pit around a week ago.  Last night she got out of a hot shower and the pain increased as well as swelling.  Pt reports history of multiple abscesses that have been I&D.

## 2015-09-29 ENCOUNTER — Encounter: Payer: Medicaid Other | Admitting: Obstetrics

## 2015-10-04 ENCOUNTER — Ambulatory Visit (INDEPENDENT_AMBULATORY_CARE_PROVIDER_SITE_OTHER): Payer: Medicaid Other | Admitting: Obstetrics

## 2015-10-04 VITALS — BP 107/71 | HR 80 | Temp 97.0°F | Wt 153.0 lb

## 2015-10-04 DIAGNOSIS — Z3482 Encounter for supervision of other normal pregnancy, second trimester: Secondary | ICD-10-CM | POA: Diagnosis not present

## 2015-10-04 DIAGNOSIS — Z1389 Encounter for screening for other disorder: Secondary | ICD-10-CM | POA: Diagnosis not present

## 2015-10-04 DIAGNOSIS — Z331 Pregnant state, incidental: Secondary | ICD-10-CM | POA: Diagnosis not present

## 2015-10-04 LAB — POCT URINALYSIS DIPSTICK
BILIRUBIN UA: NEGATIVE
Glucose, UA: NEGATIVE
KETONES UA: NEGATIVE
Leukocytes, UA: NEGATIVE
Nitrite, UA: NEGATIVE
PH UA: 5
Protein, UA: NEGATIVE
RBC UA: NEGATIVE
Spec Grav, UA: 1.015
Urobilinogen, UA: NEGATIVE

## 2015-10-05 ENCOUNTER — Encounter: Payer: Self-pay | Admitting: Obstetrics

## 2015-10-05 NOTE — Progress Notes (Signed)
  Subjective:    Loleta DickerSaudia E River is a 25 y.o. female being seen today for her obstetrical visit. She is at 2151w1d gestation. Patient reports: no complaints.  Problem List Items Addressed This Visit    None    Visit Diagnoses    Encounter for supervision of other normal pregnancy in second trimester    -  Primary    Relevant Orders    POCT urinalysis dipstick (Completed)    AFP, Quad Screen      There are no active problems to display for this patient.   Objective:     BP 107/71 mmHg  Pulse 80  Temp(Src) 97 F (36.1 C)  Wt 153 lb (69.4 kg)  LMP 06/21/2015 Uterine Size: Below umbilicus     Assessment:    Pregnancy @ 6551w1d  weeks Doing well    Plan:    Problem list reviewed and updated. Labs reviewed.  Follow up in 4 weeks. FIRST/CF mutation testing/NIPT/QUAD SCREEN/fragile X/Ashkenazi Jewish population testing/Spinal muscular atrophy discussed: requested. Role of ultrasound in pregnancy discussed; fetal survey: requested. Amniocentesis discussed: not indicated.

## 2015-10-11 LAB — AFP, QUAD SCREEN
DIA Mom Value: 1.45
DIA Value (EIA): 275 pg/mL
DSR (BY AGE) 1 IN: 918
DSR (SECOND TRIMESTER) 1 IN: 10000
Gestational Age: 15 WEEKS
MSAFP MOM: 2.14
MSAFP: 64.6 ng/mL
MSHCG MOM: 0.43
MSHCG: 23552 m[IU]/mL
Maternal Age At EDD: 27.1 YEARS
Osb Risk: 1188
PDF: 0
TEST RESULTS AFP: NEGATIVE
WEIGHT: 153 [lb_av]
uE3 Mom: 1.51
uE3 Value: 0.89 ng/mL

## 2015-10-21 ENCOUNTER — Encounter (HOSPITAL_COMMUNITY): Payer: Self-pay | Admitting: Obstetrics

## 2015-10-27 ENCOUNTER — Other Ambulatory Visit (HOSPITAL_COMMUNITY): Payer: Medicaid Other

## 2015-10-27 ENCOUNTER — Encounter (HOSPITAL_COMMUNITY): Payer: Self-pay

## 2015-10-28 ENCOUNTER — Encounter (HOSPITAL_COMMUNITY): Payer: Self-pay

## 2015-10-28 ENCOUNTER — Ambulatory Visit (HOSPITAL_COMMUNITY)
Admission: RE | Admit: 2015-10-28 | Discharge: 2015-10-28 | Disposition: A | Discharge status: Home / Self Care | Payer: Medicaid Other | Source: Ambulatory Visit | Attending: Obstetrics | Admitting: Obstetrics

## 2015-10-28 DIAGNOSIS — O358XX0 Maternal care for other (suspected) fetal abnormality and damage, not applicable or unspecified: Secondary | ICD-10-CM

## 2015-10-28 DIAGNOSIS — O09211 Supervision of pregnancy with history of pre-term labor, first trimester: Secondary | ICD-10-CM

## 2015-10-28 DIAGNOSIS — Z36 Encounter for antenatal screening of mother: Secondary | ICD-10-CM | POA: Diagnosis not present

## 2015-10-28 DIAGNOSIS — O09212 Supervision of pregnancy with history of pre-term labor, second trimester: Secondary | ICD-10-CM | POA: Insufficient documentation

## 2015-10-28 DIAGNOSIS — O35BXX0 Maternal care for other (suspected) fetal abnormality and damage, fetal cardiac anomalies, not applicable or unspecified: Secondary | ICD-10-CM

## 2015-10-28 DIAGNOSIS — Z3A18 18 weeks gestation of pregnancy: Secondary | ICD-10-CM | POA: Diagnosis not present

## 2015-10-28 NOTE — Progress Notes (Signed)
MFM consultation, staff Note: ? PTB history:  I reviewed with the patient the pathophysiology of preterm delivery in pregnancy and that often the etiology of the preterm delivery is not ascertained from the clinical scenario. In this patient's case she presented in the preterm labor followed by preterm delivery at 13 and 35 weeks, noting use of oral castor oil in the last prior to the last late preterm delivery. ? Infection may be a cause of preterm labor and preterm delivery. Advanced cervical dilation can also lead to infection; therefore, there are 2 mechanisms that could be contributing to the preterm delivery. Nonetheless the recurrence for spontaneous preterm birth is 2 times higher than the general population in women who experience a prior preterm delivery and the actual risk can be in the range of 50% to 60%.   The risk of preterm delivery increases with each prior preterm birth. The most recent birth is the most predictive of risk for future preterm birth, which was demonstrated in the [place] study by [employer] al in 1981. The risk increases further as the gestational age of the index preterm birth declines, especially with gestational age before 71 weeks. The etiology of recurrent preterm delivery is often not clear but has been related to shortened cervix, infection and short inter-pregnancy interval.   I did remind the patient that there is no treatment for preterm delivery, but there are ways to follow the next pregnancy in order to make appropriate interventions. The cervical length was measured via transvaginal ultrasound at 18 weeks' gestational age today.  Given the length of >3cm, I would recommend a final assessment at 22 weeks given the data taken from the largest and most widely published study on preterm delivery [facility] Preterm Prevention study) in which Ronna Polio et al showed a number of key findings, namely the cervical length at 22-23 6/7 weeks as having excellent  predictive value.   If the cervical length is measured at >29mm, then we will advise performing screening measurement only with preterm labor symptoms (as clinically indicated.     If the cervix measures less than 25 mm, we would then recommend a follow up maternal fetal medicine consultation with the patient regarding the need for supplemental vaginal progesterone and more close surveillance depending on actual length. Again, this would be a discussion that would need to be undertaken at that time depending on the clinical scenario.   I also advised the patient undergo weekly intramuscular injections of 17-hydroxyprogesterone caproate (17-OHP) from [redacted] wks GA until 36 weeks' gestational age. This is a once per week intramuscular injection which is supported by compelling scientific evidence to decrease the risk of recurrent preterm delivery in women with a prior history of preterm delivery. I did remind the patient that this is not a treatment for preterm delivery, but rather an intervention that may significantly reduce the recurrence of preterm delivery, especially at less than 28 weeks' GA and 32 weeks' gestational age.   Echogenic intracardiac focus (EIF) on the ultrasound today: This finding has been implicated as a marker for increased risk of aneuploidy, especially Trisomy 21. However, it is also seen as a normal variant in 2-6% of all pregnancy ultrasound exams. Although its significance as an isolated finding is not entirely clear, a consensus opinion would be that an isolated EIF (with no other risk factors or suspicious ultrasound findings) does not significantly increase risk of aneuploidy in an otherwise low risk patient (as in this patient's case).  The echogenic  focus is not a "heart defect" and no special fetal surveillance or neonatal cardiac evaluation is recommended.  Summary of Recommendations: 1. Cervical length at 22-23 6/7 weeks 2. I also advised the patient undergo weekly  intramuscular injections of 17-hydroxyprogesterone caproate (17-OHP) from [redacted] wks GA until 36 weeks' gestational age.  3. EIF:  Patient felt reassured by her persistent low risk status and did not wish to pursue any additional testing.  This requires no follow up or intervention postnatally and is felt to be a normal structural variant seen in 5% of African-American patients.  Time Spent: I spent in excess of 40 minutes in consultation with this patient to review records, evaluate her case, and provide her with an adequate discussion and education. More than 50% of this time was spent in direct face-to-face counseling. ? It was a pleasure seeing your patient in the office today. Thank you for consultation. Please do not hesitate to contact our service for any further questions.  ? Thank you, ? Louann Sjogren Julia Russell  ? Rogelia Boga, MD, MS, FACOG Assistant Professor Section of Maternal-Fetal Medicine [facility]

## 2015-10-29 ENCOUNTER — Other Ambulatory Visit: Payer: Self-pay | Admitting: Obstetrics

## 2015-11-01 ENCOUNTER — Ambulatory Visit (INDEPENDENT_AMBULATORY_CARE_PROVIDER_SITE_OTHER): Payer: Medicaid Other | Admitting: Obstetrics

## 2015-11-01 ENCOUNTER — Encounter: Payer: Self-pay | Admitting: Obstetrics

## 2015-11-01 VITALS — BP 96/61 | HR 78 | Temp 98.2°F | Wt 155.8 lb

## 2015-11-01 DIAGNOSIS — Z3492 Encounter for supervision of normal pregnancy, unspecified, second trimester: Secondary | ICD-10-CM | POA: Diagnosis not present

## 2015-11-01 DIAGNOSIS — Z1389 Encounter for screening for other disorder: Secondary | ICD-10-CM | POA: Diagnosis not present

## 2015-11-01 DIAGNOSIS — Z331 Pregnant state, incidental: Secondary | ICD-10-CM

## 2015-11-01 LAB — POCT URINALYSIS DIPSTICK
Bilirubin, UA: NEGATIVE
Blood, UA: NEGATIVE
Glucose, UA: NEGATIVE
Ketones, UA: NEGATIVE
NITRITE UA: NEGATIVE
PH UA: 7
Spec Grav, UA: 1.015
Urobilinogen, UA: >=8

## 2015-11-01 NOTE — Progress Notes (Signed)
Subjective:    Julia DickerSaudia E Russell is a 25 y.o. female being seen today for her obstetrical visit. She is at 5369w0d gestation. Patient reports: no complaints . Fetal movement: normal.  Problem List Items Addressed This Visit    None    Visit Diagnoses    Prenatal care, second trimester    -  Primary   Relevant Orders   POCT Urinalysis Dipstick (Completed)     There are no active problems to display for this patient.  Objective:    BP 96/61   Pulse 78   Temp 98.2 F (36.8 C)   Wt 155 lb 12.8 oz (70.7 kg)   LMP 06/21/2015   BMI 24.40 kg/m  FHT: 150 BPM  Uterine Size: size equals dates     Assessment:    Pregnancy @ 5869w0d    Plan:    Signs and symptoms of preterm labor: discussed.  Labs, problem list reviewed and updated 2 hr GTT planned Follow up in 4 weeks.

## 2015-11-01 NOTE — Progress Notes (Signed)
Pt states she would like to discuss having tubes tied.

## 2015-11-25 ENCOUNTER — Encounter (HOSPITAL_COMMUNITY): Payer: Self-pay

## 2015-11-25 ENCOUNTER — Ambulatory Visit (HOSPITAL_COMMUNITY)
Admission: RE | Admit: 2015-11-25 | Discharge: 2015-11-25 | Disposition: A | Discharge status: Home / Self Care | Payer: Medicaid Other | Source: Ambulatory Visit | Attending: Obstetrics | Admitting: Obstetrics

## 2015-11-25 DIAGNOSIS — Z3A22 22 weeks gestation of pregnancy: Secondary | ICD-10-CM | POA: Insufficient documentation

## 2015-11-25 DIAGNOSIS — O09212 Supervision of pregnancy with history of pre-term labor, second trimester: Secondary | ICD-10-CM | POA: Insufficient documentation

## 2015-11-25 DIAGNOSIS — O09211 Supervision of pregnancy with history of pre-term labor, first trimester: Secondary | ICD-10-CM

## 2015-11-29 ENCOUNTER — Encounter: Payer: Medicaid Other | Admitting: Obstetrics & Gynecology

## 2015-12-09 ENCOUNTER — Other Ambulatory Visit (HOSPITAL_COMMUNITY): Payer: Self-pay | Admitting: Maternal and Fetal Medicine

## 2015-12-09 ENCOUNTER — Encounter (HOSPITAL_COMMUNITY): Payer: Self-pay

## 2015-12-09 ENCOUNTER — Ambulatory Visit (HOSPITAL_COMMUNITY)
Admission: RE | Admit: 2015-12-09 | Discharge: 2015-12-09 | Disposition: A | Discharge status: Home / Self Care | Payer: Medicaid Other | Source: Ambulatory Visit | Attending: Obstetrics | Admitting: Obstetrics

## 2015-12-09 DIAGNOSIS — O09211 Supervision of pregnancy with history of pre-term labor, first trimester: Secondary | ICD-10-CM | POA: Diagnosis present

## 2015-12-09 DIAGNOSIS — Z3A24 24 weeks gestation of pregnancy: Secondary | ICD-10-CM

## 2015-12-09 DIAGNOSIS — Z8751 Personal history of pre-term labor: Secondary | ICD-10-CM

## 2015-12-14 ENCOUNTER — Encounter: Payer: Self-pay | Admitting: Obstetrics & Gynecology

## 2016-01-05 ENCOUNTER — Ambulatory Visit (INDEPENDENT_AMBULATORY_CARE_PROVIDER_SITE_OTHER): Payer: Medicaid Other | Admitting: Certified Nurse Midwife

## 2016-01-05 DIAGNOSIS — O09213 Supervision of pregnancy with history of pre-term labor, third trimester: Secondary | ICD-10-CM

## 2016-01-05 DIAGNOSIS — Z3493 Encounter for supervision of normal pregnancy, unspecified, third trimester: Secondary | ICD-10-CM

## 2016-01-05 DIAGNOSIS — O09893 Supervision of other high risk pregnancies, third trimester: Secondary | ICD-10-CM | POA: Insufficient documentation

## 2016-01-05 DIAGNOSIS — O0990 Supervision of high risk pregnancy, unspecified, unspecified trimester: Secondary | ICD-10-CM | POA: Insufficient documentation

## 2016-01-05 DIAGNOSIS — O0993 Supervision of high risk pregnancy, unspecified, third trimester: Secondary | ICD-10-CM

## 2016-01-05 MED ORDER — VITAFOL GUMMIES 3.33-0.333-34.8 MG PO CHEW: 3.0000 | CHEWABLE_TABLET | Freq: Every day | ORAL | 11 refills | Status: DC

## 2016-01-05 NOTE — Progress Notes (Signed)
Patient would like to sign BTL forms.

## 2016-01-05 NOTE — Progress Notes (Signed)
Subjective:    Julia Russell is a 25 y.o. female being seen today for her obstetrical visit. She is at 682w2d gestation. Patient reports no complaints. Fetal movement: normal.  Discussed side effects of BTL.  Has used Nexplanon in the past.  Discussed importance of regular prenatal care; states that she has had transportation issues.    Problem List Items Addressed This Visit      Other   History of preterm delivery, currently pregnant in third trimester   Relevant Medications   Prenatal Vit-Fe Phos-FA-Omega (VITAFOL GUMMIES) 3.33-0.333-34.8 MG CHEW   Supervision of high-risk pregnancy    Other Visit Diagnoses    Prenatal care, first trimester       Relevant Medications   Prenatal Vit-Fe Phos-FA-Omega (VITAFOL GUMMIES) 3.33-0.333-34.8 MG CHEW     Patient Active Problem List   Diagnosis Date Noted  . History of preterm delivery, currently pregnant in third trimester 01/05/2016  . Supervision of high-risk pregnancy 01/05/2016   Objective:    BP 97/60   Pulse 82   Temp 97.5 F (36.4 C)   Wt 159 lb 4.8 oz (72.3 kg)   LMP 06/21/2015   BMI 24.95 kg/m  FHT:  146 BPM  Uterine Size: 26 cm and size equals dates  Presentation: cephalic     Assessment:    Pregnancy @ 242w2d weeks   Limited prenatal care  17-P injections: 1st one today  contraception counseling  Plan:     labs reviewed, problem list updated Consent signed. GBS planning TDAP offered  Rhogam given for RH negative Pediatrician: discussed. Infant feeding: plans to breastfeed. Maternity leave: n/a. Cigarette smoking: never smoked. No orders of the defined types were placed in this encounter.  Meds ordered this encounter  Medications  . Prenatal Vit-Fe Phos-FA-Omega (VITAFOL GUMMIES) 3.33-0.333-34.8 MG CHEW    Sig: Chew 3 tablets by mouth daily before breakfast.    Dispense:  90 tablet    Refill:  11   Follow up in 2 Weeks for ROB, Friday for 2 hour OGTT, 1 week for 17-P injections.

## 2016-01-06 ENCOUNTER — Other Ambulatory Visit: Payer: Medicaid Other

## 2016-01-06 MED ORDER — HYDROXYPROGESTERONE CAPROATE 250 MG/ML IM OIL
250.0000 mg | TOPICAL_OIL | Freq: Once | INTRAMUSCULAR | Status: AC
  Administered 2016-01-05: 250 mg via INTRAMUSCULAR

## 2016-01-06 NOTE — Addendum Note (Signed)
Addended by: Francene FindersJAMES, QUINETTA C on: 01/06/2016 11:42 AM   Modules accepted: Orders

## 2016-01-12 ENCOUNTER — Ambulatory Visit: Payer: Medicaid Other

## 2016-01-19 ENCOUNTER — Ambulatory Visit: Payer: Medicaid Other

## 2016-01-20 ENCOUNTER — Ambulatory Visit (INDEPENDENT_AMBULATORY_CARE_PROVIDER_SITE_OTHER): Payer: Medicaid Other | Admitting: Certified Nurse Midwife

## 2016-01-20 DIAGNOSIS — O09213 Supervision of pregnancy with history of pre-term labor, third trimester: Secondary | ICD-10-CM

## 2016-01-20 DIAGNOSIS — O0993 Supervision of high risk pregnancy, unspecified, third trimester: Secondary | ICD-10-CM

## 2016-01-20 MED ORDER — HYDROXYPROGESTERONE CAPROATE 250 MG/ML IM OIL
250.0000 mg | TOPICAL_OIL | Freq: Once | INTRAMUSCULAR | Status: AC
  Administered 2016-01-20: 250 mg via INTRAMUSCULAR

## 2016-01-20 NOTE — Progress Notes (Signed)
Administered 17p injection and patient tolerated well .Marland Kitchen. Administrations This Visit    hydroxyprogesterone caproate (MAKENA) 250 mg/mL injection 250 mg    Admin Date 01/20/2016 Action Given Dose 250 mg Route Intramuscular Administered By Katrina StackBrittany D Stalling, RN

## 2016-01-20 NOTE — Progress Notes (Signed)
Subjective:    Loleta DickerSaudia E Trabucco is a 25 y.o. female being seen today for her obstetrical visit. She is at 8161w3d gestation. Patient reports backache, no bleeding, no contractions, no cramping and no leaking. Fetal movement: normal.  Reports signs of depression.    Problem List Items Addressed This Visit      Other   Supervision of high-risk pregnancy   Relevant Medications   hydroxyprogesterone caproate (MAKENA) 250 mg/mL injection 250 mg (Completed)    Other Visit Diagnoses   None.    Patient Active Problem List   Diagnosis Date Noted  . History of preterm delivery, currently pregnant in third trimester 01/05/2016  . Supervision of high-risk pregnancy 01/05/2016   Objective:    BP 100/66   Pulse 92   Temp 97 F (36.1 C)   Wt 163 lb 9.6 oz (74.2 kg)   LMP 06/21/2015   BMI 25.62 kg/m  FHT:  147 BPM  Uterine Size: 30 cm and size equals dates  Presentation: cephalic     Assessment:    Pregnancy @ 6861w3d weeks   Hx of PTD/PTL: on 17-P injections  Plan:     labs reviewed, problem list updated Consent signed. GBS planning TDAP offered  Rhogam given for RH negative Pediatrician: discussed. Infant feeding: plans to breastfeed. Maternity leave: discussed. Cigarette smoking: smokes 1/2 PPD. No orders of the defined types were placed in this encounter.  Meds ordered this encounter  Medications  . hydroxyprogesterone caproate (MAKENA) 250 mg/mL injection 250 mg   Follow up in 2 Weeks.

## 2016-01-20 NOTE — Progress Notes (Signed)
Patient is in office reports good fetal movement.

## 2016-01-27 ENCOUNTER — Encounter: Payer: Self-pay | Admitting: Certified Nurse Midwife

## 2016-01-27 ENCOUNTER — Ambulatory Visit: Payer: Medicaid Other

## 2016-02-03 ENCOUNTER — Ambulatory Visit (INDEPENDENT_AMBULATORY_CARE_PROVIDER_SITE_OTHER): Payer: Medicaid Other | Admitting: Certified Nurse Midwife

## 2016-02-03 VITALS — BP 112/75 | HR 87 | Temp 97.5°F | Wt 167.0 lb

## 2016-02-03 DIAGNOSIS — IMO0002 Reserved for concepts with insufficient information to code with codable children: Secondary | ICD-10-CM

## 2016-02-03 DIAGNOSIS — O09213 Supervision of pregnancy with history of pre-term labor, third trimester: Secondary | ICD-10-CM

## 2016-02-03 DIAGNOSIS — O0993 Supervision of high risk pregnancy, unspecified, third trimester: Secondary | ICD-10-CM

## 2016-02-03 DIAGNOSIS — O9A313 Physical abuse complicating pregnancy, third trimester: Secondary | ICD-10-CM | POA: Diagnosis not present

## 2016-02-03 MED ORDER — HYDROXYPROGESTERONE CAPROATE 250 MG/ML IM OIL
250.0000 mg | TOPICAL_OIL | INTRAMUSCULAR | Status: DC
  Administered 2016-02-03 – 2016-02-24 (×2): 250 mg via INTRAMUSCULAR

## 2016-02-03 NOTE — Progress Notes (Signed)
Patient states that she has had 1-2 contractions, reports good fetal movement.

## 2016-02-03 NOTE — Progress Notes (Signed)
Administered 17p, patient tolerated well .Marland Kitchen. Administrations This Visit    hydroxyprogesterone caproate (MAKENA) 250 mg/mL injection 250 mg    Admin Date 02/03/2016 Action Given Dose 250 mg Route Intramuscular Administered By Katrina StackBrittany D Stalling, RN
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
## 2016-02-10 ENCOUNTER — Ambulatory Visit: Payer: Medicaid Other

## 2016-02-20 ENCOUNTER — Encounter: Payer: Self-pay | Admitting: Certified Nurse Midwife

## 2016-02-20 ENCOUNTER — Encounter: Payer: Medicaid Other | Admitting: Certified Nurse Midwife

## 2016-02-21 ENCOUNTER — Telehealth: Payer: Self-pay | Admitting: *Deleted

## 2016-02-21 ENCOUNTER — Inpatient Hospital Stay (HOSPITAL_COMMUNITY)
Admission: AD | Admit: 2016-02-21 | Discharge: 2016-02-21 | Disposition: A | Discharge status: Home / Self Care | Payer: Medicaid Other | Source: Ambulatory Visit | Attending: Family Medicine | Admitting: Family Medicine

## 2016-02-21 ENCOUNTER — Encounter: Payer: Self-pay | Admitting: Certified Nurse Midwife

## 2016-02-21 ENCOUNTER — Encounter (HOSPITAL_COMMUNITY): Payer: Self-pay | Admitting: *Deleted

## 2016-02-21 DIAGNOSIS — O2343 Unspecified infection of urinary tract in pregnancy, third trimester: Secondary | ICD-10-CM

## 2016-02-21 DIAGNOSIS — N760 Acute vaginitis: Secondary | ICD-10-CM | POA: Insufficient documentation

## 2016-02-21 DIAGNOSIS — Z91040 Latex allergy status: Secondary | ICD-10-CM | POA: Diagnosis not present

## 2016-02-21 DIAGNOSIS — O4703 False labor before 37 completed weeks of gestation, third trimester: Secondary | ICD-10-CM | POA: Insufficient documentation

## 2016-02-21 DIAGNOSIS — O23593 Infection of other part of genital tract in pregnancy, third trimester: Secondary | ICD-10-CM | POA: Insufficient documentation

## 2016-02-21 DIAGNOSIS — Z8249 Family history of ischemic heart disease and other diseases of the circulatory system: Secondary | ICD-10-CM | POA: Insufficient documentation

## 2016-02-21 DIAGNOSIS — B9689 Other specified bacterial agents as the cause of diseases classified elsewhere: Secondary | ICD-10-CM | POA: Diagnosis not present

## 2016-02-21 DIAGNOSIS — Z833 Family history of diabetes mellitus: Secondary | ICD-10-CM | POA: Diagnosis not present

## 2016-02-21 DIAGNOSIS — Z3A35 35 weeks gestation of pregnancy: Secondary | ICD-10-CM | POA: Diagnosis not present

## 2016-02-21 DIAGNOSIS — Z87891 Personal history of nicotine dependence: Secondary | ICD-10-CM | POA: Diagnosis not present

## 2016-02-21 DIAGNOSIS — R109 Unspecified abdominal pain: Secondary | ICD-10-CM | POA: Diagnosis present

## 2016-02-21 LAB — URINALYSIS, ROUTINE W REFLEX MICROSCOPIC
BILIRUBIN URINE: NEGATIVE
Glucose, UA: NEGATIVE mg/dL
Hgb urine dipstick: NEGATIVE
KETONES UR: NEGATIVE mg/dL
NITRITE: NEGATIVE
PROTEIN: NEGATIVE mg/dL
SPECIFIC GRAVITY, URINE: 1.01 (ref 1.005–1.030)
pH: 8 (ref 5.0–8.0)

## 2016-02-21 LAB — WET PREP, GENITAL
Sperm: NONE SEEN
TRICH WET PREP: NONE SEEN
YEAST WET PREP: NONE SEEN

## 2016-02-21 LAB — URINE MICROSCOPIC-ADD ON
BACTERIA UA: NONE SEEN
RBC / HPF: NONE SEEN RBC/hpf (ref 0–5)

## 2016-02-21 LAB — AMNISURE RUPTURE OF MEMBRANE (ROM) NOT AT ARMC: AMNISURE: NEGATIVE

## 2016-02-21 MED ORDER — BETAMETHASONE SOD PHOS & ACET 6 (3-3) MG/ML IJ SUSP
12.0000 mg | Freq: Once | INTRAMUSCULAR | Status: AC
  Administered 2016-02-21: 12 mg via INTRAMUSCULAR
  Filled 2016-02-21: qty 2

## 2016-02-21 MED ORDER — LACTATED RINGERS IV BOLUS (SEPSIS)
1000.0000 mL | Freq: Once | INTRAVENOUS | Status: AC
  Administered 2016-02-21: 1000 mL via INTRAVENOUS

## 2016-02-21 MED ORDER — METRONIDAZOLE 500 MG PO TABS: 500.0000 mg | ORAL_TABLET | Freq: Two times a day (BID) | ORAL | 0 refills | Status: DC

## 2016-02-21 MED ORDER — CEPHALEXIN 500 MG PO CAPS: 500.0000 mg | ORAL_CAPSULE | Freq: Four times a day (QID) | ORAL | 0 refills | Status: DC

## 2016-02-21 NOTE — Telephone Encounter (Signed)
Patient requested call back- no reason left. 11:48 Call to patient- she asked if she could call back at a more convenient time. Told her OK and hung up.

## 2016-02-21 NOTE — MAU Note (Signed)
States she may have been leaking fluid since yesterday. Noted again today. C/O contractions and pressure. [place] [facility] [place] [facility] Vaginal D/C noted. L. KirbyCNM in to assess patient.

## 2016-02-21 NOTE — MAU Provider Note (Signed)
Chief Complaint:  Contractions   First Provider Initiated Contact with Patient 02/21/16 1457      HPI: Julia Russell is a 25 y.o. [id number] at 6049w0d who presents to maternity admissions via EMS for leakage of clear fluid since last night and onset of cramping last night with worsening cramping and pelvic pressure starting today. She describes the pelvic pressure as constant with intermittent abdominal tightening making it worse.  She has not tried any treatments and reports nothing makes the pain better.   She reports good fetal movement, denies LOF, vaginal bleeding, vaginal itching/burning, urinary symptoms, h/a, dizziness, n/v, or fever/chills.    HPI  Past Medical History: Past Medical History:  Diagnosis Date  . Abscess     Past obstetric history: OB History  Gravida Para Term Preterm AB Living  5 4 2 2   4   SAB TAB Ectopic Multiple Live Births          4    # Outcome Date GA Lbr Len/2nd Weight Sex Delivery Anes PTL Lv  5 Current           4 Preterm 09/04/09 2664w5d  4 lb (1.814 kg) F Vag-Spont None Y LIV  3 Preterm 10/08/07 4542w1d  4 lb 2 oz (1.871 kg) F Vag-Spont None Y LIV  2 Term 06/25/06 5115w0d  6 lb 9 oz (2.977 kg) M Vag-Spont None Y LIV  1 Term 07/22/03 7015w0d  6 lb 9 oz (2.977 kg) M Vag-Spont EPI N LIV      Past Surgical History: History reviewed. No pertinent surgical history.  Family History: Family History  Problem Relation Age of Onset  . Hypertension Mother   . Diabetes Mother   . Cancer Mother   . Cancer Maternal Grandmother   . Diabetes Maternal Grandmother     Social History: Social History  Substance Use Topics  . Smoking status: Former Smoker    Types: Cigarettes    Quit date: 08/13/2015  . Smokeless tobacco: Never Used  . Alcohol use No    Allergies:  Allergies  Allergen Reactions  . Latex Itching and Rash    Meds:  Facility-Administered Medications Prior to Admission  Medication Dose Route Frequency Provider Last Rate Last Dose  .  hydroxyprogesterone caproate (MAKENA) 250 mg/mL injection 250 mg  250 mg Intramuscular Weekly Rachelle A Denney, CNM   250 mg at 02/03/16 1005   Prescriptions Prior to Admission  Medication Sig Dispense Refill Last Dose  . hydroxyprogesterone caproate (MAKENA) 250 mg/mL OIL injection Inject 250 mg into the muscle every Friday.   02/10/2016 at unknown  . Prenatal Vit-Fe Phos-FA-Omega (VITAFOL GUMMIES) 3.33-0.333-34.8 MG CHEW Chew 3 each by mouth daily.   02/20/2016 at Unknown time    ROS:  Review of Systems  Constitutional: Negative for chills, fatigue and fever.  Eyes: Negative for visual disturbance.  Respiratory: Negative for shortness of breath.   Cardiovascular: Negative for chest pain.  Gastrointestinal: Positive for abdominal pain. Negative for nausea and vomiting.  Genitourinary: Positive for pelvic pain and vaginal discharge. Negative for difficulty urinating, dysuria, flank pain, vaginal bleeding and vaginal pain.  Neurological: Negative for dizziness and headaches.  Psychiatric/Behavioral: Negative.      I have reviewed patient's Past Medical Hx, Surgical Hx, Family Hx, Social Hx, medications and allergies.   Physical Exam   Patient Vitals for the past 24 hrs:  BP Temp Temp src Pulse Resp  02/21/16 1724 112/59 - - 62 -  02/21/16 1457 - 97.8  F (36.6 C) Oral - -  02/21/16 1452 121/67 - - 78 18   Constitutional: Well-developed, well-nourished female in no acute distress.  Cardiovascular: normal rate Respiratory: normal effort GI: Abd soft, non-tender, gravid appropriate for gestational age.  MS: Extremities nontender, no edema, normal ROM Neurologic: Alert and oriented x 4.  GU: Neg CVAT.  Wet prep collected by blind swab  Dilation: 5 Effacement (%): 80 Cervical Position: Middle Station: -2, Ballotable Presentation: Vertex Exam by:: Clayton Lefort, CNM  FHT:  Baseline 135, moderate variability, accelerations present, no decelerations Contractions: q 10-15 mins,  mild to palpation   Labs: Results for orders placed or performed during the hospital encounter of 02/21/16 (from the past 24 hour(s))  Amnisure rupture of membrane (rom)not at [facility]     Status: None   Collection Time: 02/21/16  2:58 PM  Result Value Ref Range   Amnisure ROM NEGATIVE   Urinalysis, Routine w reflex microscopic (not at [facility])     Status: Abnormal   Collection Time: 02/21/16  4:09 PM  Result Value Ref Range   Color, Urine YELLOW YELLOW   APPearance HAZY (A) CLEAR   Specific Gravity, Urine 1.010 1.005 - 1.030   pH 8.0 5.0 - 8.0   Glucose, UA NEGATIVE NEGATIVE mg/dL   Hgb urine dipstick NEGATIVE NEGATIVE   Bilirubin Urine NEGATIVE NEGATIVE   Ketones, ur NEGATIVE NEGATIVE mg/dL   Protein, ur NEGATIVE NEGATIVE mg/dL   Nitrite NEGATIVE NEGATIVE   Leukocytes, UA LARGE (A) NEGATIVE  Urine microscopic-add on     Status: Abnormal   Collection Time: 02/21/16  4:09 PM  Result Value Ref Range   Squamous Epithelial / LPF 0-5 (A) NONE SEEN   WBC, UA 6-30 0 - 5 WBC/hpf   RBC / HPF NONE SEEN 0 - 5 RBC/hpf   Bacteria, UA NONE SEEN NONE SEEN  Wet prep, genital     Status: Abnormal   Collection Time: 02/21/16  4:24 PM  Result Value Ref Range   Yeast Wet Prep HPF POC NONE SEEN NONE SEEN   Trich, Wet Prep NONE SEEN NONE SEEN   Clue Cells Wet Prep HPF POC PRESENT (A) NONE SEEN   WBC, Wet Prep HPF POC MANY (A) NONE SEEN   Sperm NONE SEEN    B/Positive/-- (06/08 1316)  Imaging:  No results found.  MAU Course/MDM: I have ordered labs and reviewed results.  Called to room for evaluation when pt arrived from EMS because of significant discomfort and report of pelvic pressure.  Cervix 5 cm but ballotable station.  Then, monitors applied and IV started by RN.   LR x 1000 ml given NST reviewed and reactive Cervix unchanged in 1 hour in MAU.  Consult Dr Shawnie Pons with presentation, exam findings and test results. Plan to have pt walk/wait 1 hour and recheck cervix. Cervix remains unchanged in  >3 hours in MAU.  Consult Dr Debroah Loop.  D/C home with preterm labor precautions Plan to return to MAU tomorrow for second dose of BMZ Keep scheduled appts with Dr Clearance Coots Treatments in MAU included LR x 2000 ml.   Will treat for UTI and BV with Keflex QID x 7 days and Flagyl BID x 7 days Pt stable at time of discharge.  Today's evaluation included a work-up for preterm labor which can be life-threatening for both mom and baby.  Assessment: 1. Urinary tract infection in mother during third trimester of pregnancy   2. BV (bacterial vaginosis)   3. Threatened preterm  labor, third trimester     Plan: Discharge home Labor precautions and fetal kick counts  Follow-up Information    [facility]   Why:  As scheduled, return to MAU with signs of labor or emergencies Contact information: [address] [place] [id number] [phone]           Medication List    TAKE these medications   hydroxyprogesterone caproate 250 mg/mL Oil injection Commonly known as:  MAKENA Inject 250 mg into the muscle every Friday.   VITAFOL GUMMIES 3.33-0.333-34.8 MG Chew Chew 3 each by mouth daily.       Sharen CounterLisa Leftwich-Kirby Certified Nurse-Midwife 02/21/2016 6:01 PM

## 2016-02-21 NOTE — Discharge Instructions (Signed)
Bacterial Vaginosis Bacterial vaginosis is a vaginal infection that occurs when the normal balance of bacteria in the vagina is disrupted. It results from an overgrowth of certain bacteria. This is the most common vaginal infection among women ages 55-44. Because bacterial vaginosis increases your risk for STIs (sexually transmitted infections), getting treated can help reduce your risk for chlamydia, gonorrhea, herpes, and HIV (human immunodeficiency virus). Treatment is also important for preventing complications in pregnant women, because this condition can cause an early (premature) delivery. What are the causes? This condition is caused by an increase in harmful bacteria that are normally present in small amounts in the vagina. However, the reason that the condition develops is not fully understood. What increases the risk? The following factors may make you more likely to develop this condition:  Having a new sexual partner or multiple sexual partners.  Having unprotected sex.  Douching.  Having an intrauterine device (IUD).  Smoking.  Drug and alcohol abuse.  Taking certain antibiotic medicines.  Being pregnant. You cannot get bacterial vaginosis from toilet seats, bedding, swimming pools, or contact with objects around you. What are the signs or symptoms? Symptoms of this condition include:  Grey or white vaginal discharge. The discharge can also be watery or foamy.  A fish-like odor with discharge, especially after sexual intercourse or during menstruation.  Itching in and around the vagina.  Burning or pain with urination. Some women with bacterial vaginosis have no signs or symptoms. How is this diagnosed? This condition is diagnosed based on:  Your medical history.  A physical exam of the vagina.  Testing a sample of vaginal fluid under a microscope to look for a large amount of bad bacteria or abnormal cells. Your health care provider may use a cotton swab or a  small wooden spatula to collect the sample. How is this treated? This condition is treated with antibiotics. These may be given as a pill, a vaginal cream, or a medicine that is put into the vagina (suppository). If the condition comes back after treatment, a second round of antibiotics may be needed. Follow these instructions at home: Medicines  Take over-the-counter and prescription medicines only as told by your health care provider.  Take or use your antibiotic as told by your health care provider. Do not stop taking or using the antibiotic even if you start to feel better. General instructions  If you have a female sexual partner, tell her that you have a vaginal infection. She should see her health care provider and be treated if she has symptoms. If you have a female sexual partner, he does not need treatment.  During treatment:  Avoid sexual activity until you finish treatment.  Do not douche.  Avoid alcohol as directed by your health care provider.  Avoid breastfeeding as directed by your health care provider.  Drink enough water and fluids to keep your urine clear or pale yellow.  Keep the area around your vagina and rectum clean.  Wash the area daily with warm water.  Wipe yourself from front to back after using the toilet.  Keep all follow-up visits as told by your health care provider. This is important. How is this prevented?  Do not douche.  Wash the outside of your vagina with warm water only.  Use protection when having sex. This includes latex condoms and dental dams.  Limit how many sexual partners you have. To help prevent bacterial vaginosis, it is best to have sex with just one partner (monogamous).  Make sure you and your sexual partner are tested for STIs.  Wear cotton or cotton-lined underwear.  Avoid wearing tight pants and pantyhose, especially during summer.  Limit the amount of alcohol that you drink.  Do not use any products that contain  nicotine or tobacco, such as cigarettes and e-cigarettes. If you need help quitting, ask your health care provider.  Do not use illegal drugs. Where to find more information:  Centers for Disease Control and Prevention: SolutionApps.co.za  American Sexual Health Association (ASHA): www.ashastd.org  U.[place] Department of Health and Health and safety inspector, Office on Women's Health: ConventionalMedicines.si or http://www.anderson-williamson.info/ Contact a health care provider if:  Your symptoms do not improve, even after treatment.  You have more discharge or pain when urinating.  You have a fever.  You have pain in your abdomen.  You have pain during sex.  You have vaginal bleeding between periods. Summary  Bacterial vaginosis is a vaginal infection that occurs when the normal balance of bacteria in the vagina is disrupted.  Because bacterial vaginosis increases your risk for STIs (sexually transmitted infections), getting treated can help reduce your risk for chlamydia, gonorrhea, herpes, and HIV (human immunodeficiency virus). Treatment is also important for preventing complications in pregnant women, because the condition can cause an early (premature) delivery.  This condition is treated with antibiotic medicines. These may be given as a pill, a vaginal cream, or a medicine that is put into the vagina (suppository). This information is not intended to replace advice given to you by your health care provider. Make sure you discuss any questions you have with your health care provider. Document Released: 03/12/2005 Document Revised: 11/26/2015 Document Reviewed: 11/26/2015 Elsevier Interactive Patient Education  2017 Elsevier Inc.   Pregnancy and Urinary Tract Infection WHAT IS A URINARY TRACT INFECTION? A urinary tract infection (UTI) is an infection of any part of the urinary tract. This includes the kidneys, the tubes that connect your kidneys to your bladder  (ureters), the bladder, and the tube that carries urine out of your body (urethra). These organs make, store, and get rid of urine in the body. A UTI can be a bladder infection (cystitis) or a kidney infection (pyelonephritis). This infection may be caused by fungi, viruses, and bacteria. Bacteria are the most common cause of UTIs. You are more likely to develop a UTI during pregnancy because:  The physical and hormonal changes your body goes through can make it easier for bacteria to get into your urinary tract.  Your growing baby puts pressure on your uterus and can affect urine flow. DOES A UTI PLACE MY BABY AT RISK? An untreated UTI during pregnancy could lead to a kidney infection, which can cause health problems that could affect your baby. Possible complications of an untreated UTI include:  Having your baby before 37 weeks of pregnancy (premature).  Having a baby with a low birth weight.  Developing high blood pressure during pregnancy (preeclampsia). WHAT ARE THE SYMPTOMS OF A UTI? Symptoms of a UTI include:  Fever.  Frequent urination or passing small amounts of urine frequently.  Needing to urinate urgently.  Pain or a burning sensation with urination.  Urine that smells bad or unusual.  Cloudy urine.  Pain in the lower abdomen or back.  Trouble urinating.  Blood in the urine.  Vomiting or being less hungry than normal.  Diarrhea or abdominal pain.  Vaginal discharge. WHAT ARE THE TREATMENT OPTIONS FOR A UTI DURING PREGNANCY? Treatment for this condition may include:  Antibiotic medicines that are safe to take during pregnancy.  Other medicines to treat less common causes of UTI. HOW CAN I PREVENT A UTI? To prevent a UTI:  Go to the bathroom as soon as you feel the need.  Always wipe from front to back.  Wash your genital area with soap and warm water daily.  Empty your bladder before and after sex.  Wear cotton underwear.  Limit your intake of  high sugar foods or drinks, such as regular soda, juice, and sweets..  Drink 6-8 glasses of water daily.  Do not wear tight-fitting pants.  Do not douche or use deodorant sprays.  Do not drink alcohol, caffeine, or carbonated drinks. These can irritate the bladder. WHEN SHOULD I SEEK MEDICAL CARE? Seek medical care if:  Your symptoms do not improve or get worse.  You have a fever after two days of treatment.  You have a rash.  You have abnormal vaginal discharge.  You have back or side pain.  You have chills.  You have nausea and vomiting. WHEN SHOULD I SEEK IMMEDIATE MEDICAL CARE? Seek immediate medical care if you are pregnant and:  You feel contractions in your uterus.  You have lower belly pain.  You have a gush of fluid from your vagina.  You have blood in your urine.  You are vomiting and cannot keep down any medicines or water. This information is not intended to replace advice given to you by your health care provider. Make sure you discuss any questions you have with your health care provider. Document Released: 07/07/2010 Document Revised: 08/15/2015 Document Reviewed: 01/31/2015 Elsevier Interactive Patient Education  2017 [employer]   Preterm Labor and Birth Information The normal length of a pregnancy is 39-41 weeks. Preterm labor is when labor starts before 37 completed weeks of pregnancy. What are the risk factors for preterm labor? Preterm labor is more likely to occur in women who:  Have certain infections during pregnancy such as a bladder infection, sexually transmitted infection, or infection inside the uterus (chorioamnionitis).  Have a shorter-than-normal cervix.  Have gone into preterm labor before.  Have had surgery on their cervix.  Are younger than age 27 or older than age 27.  Are African American.  Are pregnant with twins or multiple babies (multiple gestation).  Take street drugs or smoke while pregnant.  Do not gain  enough weight while pregnant.  Became pregnant shortly after having been pregnant. What are the symptoms of preterm labor? Symptoms of preterm labor include:  Cramps similar to those that can happen during a menstrual period. The cramps may happen with diarrhea.  Pain in the abdomen or lower back.  Regular uterine contractions that may feel like tightening of the abdomen.  A feeling of increased pressure in the pelvis.  Increased watery or bloody mucus discharge from the vagina.  Water breaking (ruptured amniotic sac). Why is it important to recognize signs of preterm labor? It is important to recognize signs of preterm labor because babies who are born prematurely may not be fully developed. This can put them at an increased risk for:  Long-term (chronic) heart and lung problems.  Difficulty immediately after birth with regulating body systems, including blood sugar, body temperature, heart rate, and breathing rate.  Bleeding in the brain.  Cerebral palsy.  Learning difficulties.  Death. These risks are highest for babies who are born before 34 weeks of pregnancy. How is preterm labor treated? Treatment depends on the length of your pregnancy,  your condition, and the health of your baby. It may involve:  Having a stitch (suture) placed in your cervix to prevent your cervix from opening too early (cerclage).  Taking or being given medicines, such as:  Hormone medicines. These may be given early in pregnancy to help support the pregnancy.  Medicine to stop contractions.  Medicines to help mature the babys lungs. These may be prescribed if the risk of delivery is high.  Medicines to prevent your baby from developing cerebral palsy. If the labor happens before 34 weeks of pregnancy, you may need to stay in the hospital. What should I do if I think I am in preterm labor? If you think that you are going into preterm labor, call your health care provider right away. How  can I prevent preterm labor in future pregnancies? To increase your chance of having a full-term pregnancy:  Do not use any tobacco products, such as cigarettes, chewing tobacco, and e-cigarettes. If you need help quitting, ask your health care provider.  Do not use street drugs or medicines that have not been prescribed to you during your pregnancy.  Talk with your health care provider before taking any herbal supplements, even if you have been taking them regularly.  Make sure you gain a healthy amount of weight during your pregnancy.  Watch for infection. If you think that you might have an infection, get it checked right away.  Make sure to tell your health care provider if you have gone into preterm labor before. This information is not intended to replace advice given to you by your health care provider. Make sure you discuss any questions you have with your health care provider. Document Released: 06/02/2003 Document Revised: 08/23/2015 Document Reviewed: 08/03/2015 Elsevier Interactive Patient Education  2017 [employer]

## 2016-02-23 LAB — CULTURE, OB URINE

## 2016-02-24 ENCOUNTER — Ambulatory Visit (INDEPENDENT_AMBULATORY_CARE_PROVIDER_SITE_OTHER): Payer: Medicaid Other | Admitting: *Deleted

## 2016-02-24 VITALS — BP 103/61 | HR 74 | Wt 174.0 lb

## 2016-02-24 DIAGNOSIS — O4703 False labor before 37 completed weeks of gestation, third trimester: Secondary | ICD-10-CM

## 2016-02-24 MED ORDER — BETAMETHASONE SOD PHOS & ACET 6 (3-3) MG/ML IJ SUSP
12.0000 mg | Freq: Once | INTRAMUSCULAR | Status: AC
  Administered 2016-02-24: 12 mg via INTRAMUSCULAR

## 2016-02-24 NOTE — Progress Notes (Signed)
Patient states she was seen at [facility] and was given 1 steroid shot. She was supposed to return for the next one in 24 hours- but did not have a ride. She wants to know if she can get it today. Per Boykin Reaperachelle- OK to give in office today.

## 2016-02-26 ENCOUNTER — Encounter (HOSPITAL_COMMUNITY): Payer: Self-pay | Admitting: *Deleted

## 2016-02-26 ENCOUNTER — Inpatient Hospital Stay (HOSPITAL_COMMUNITY)
Admission: AD | Admit: 2016-02-26 | Discharge: 2016-02-28 | DRG: 775 | Disposition: A | Discharge status: Home / Self Care | Payer: Medicaid Other | Source: Ambulatory Visit | Attending: Family Medicine | Admitting: Family Medicine

## 2016-02-26 DIAGNOSIS — Z833 Family history of diabetes mellitus: Secondary | ICD-10-CM

## 2016-02-26 DIAGNOSIS — Z8249 Family history of ischemic heart disease and other diseases of the circulatory system: Secondary | ICD-10-CM | POA: Diagnosis not present

## 2016-02-26 DIAGNOSIS — Z3A35 35 weeks gestation of pregnancy: Secondary | ICD-10-CM

## 2016-02-26 DIAGNOSIS — Z87891 Personal history of nicotine dependence: Secondary | ICD-10-CM

## 2016-02-26 LAB — CBC
HEMATOCRIT: 35.4 % — AB (ref 36.0–46.0)
Hemoglobin: 11.7 g/dL — ABNORMAL LOW (ref 12.0–15.0)
MCH: 29.5 pg (ref 26.0–34.0)
MCHC: 33.1 g/dL (ref 30.0–36.0)
MCV: 89.4 fL (ref 78.0–100.0)
Platelets: 185 10*3/uL (ref 150–400)
RBC: 3.96 MIL/uL (ref 3.87–5.11)
RDW: 14.1 % (ref 11.5–15.5)
WBC: 8.6 10*3/uL (ref 4.0–10.5)

## 2016-02-26 LAB — RPR: RPR Ser Ql: NONREACTIVE

## 2016-02-26 LAB — TYPE AND SCREEN
ABO/RH(D): B POS
Antibody Screen: NEGATIVE

## 2016-02-26 LAB — WET PREP, GENITAL
CLUE CELLS WET PREP: NONE SEEN
SPERM: NONE SEEN
Trich, Wet Prep: NONE SEEN
Yeast Wet Prep HPF POC: NONE SEEN

## 2016-02-26 MED ORDER — PRENATAL MULTIVITAMIN CH
1.0000 | ORAL_TABLET | Freq: Every day | ORAL | Status: DC
  Administered 2016-02-26 – 2016-02-28 (×3): 1 via ORAL
  Filled 2016-02-26 (×4): qty 1

## 2016-02-26 MED ORDER — BENZOCAINE-MENTHOL 20-0.5 % EX AERO
1.0000 "application " | INHALATION_SPRAY | CUTANEOUS | Status: DC | PRN
  Administered 2016-02-26 – 2016-02-27 (×2): 1 via TOPICAL
  Filled 2016-02-26 (×3): qty 56

## 2016-02-26 MED ORDER — ACETAMINOPHEN 325 MG PO TABS
650.0000 mg | ORAL_TABLET | ORAL | Status: DC | PRN
  Administered 2016-02-26 – 2016-02-28 (×9): 650 mg via ORAL
  Filled 2016-02-26 (×9): qty 2

## 2016-02-26 MED ORDER — LACTATED RINGERS IV BOLUS (SEPSIS): 1000.0000 mL | Freq: Once | INTRAVENOUS | Status: DC

## 2016-02-26 MED ORDER — IBUPROFEN 600 MG PO TABS
600.0000 mg | ORAL_TABLET | Freq: Four times a day (QID) | ORAL | Status: DC
  Administered 2016-02-26 – 2016-02-28 (×10): 600 mg via ORAL
  Filled 2016-02-26 (×10): qty 1

## 2016-02-26 MED ORDER — ZOLPIDEM TARTRATE 5 MG PO TABS: 5.0000 mg | ORAL_TABLET | Freq: Every evening | ORAL | Status: DC | PRN

## 2016-02-26 MED ORDER — BETAMETHASONE SOD PHOS & ACET 6 (3-3) MG/ML IJ SUSP: 12.0000 mg | Freq: Once | INTRAMUSCULAR | Status: DC

## 2016-02-26 MED ORDER — NIFEDIPINE 10 MG PO CAPS: 10.0000 mg | ORAL_CAPSULE | Freq: Once | ORAL | Status: DC

## 2016-02-26 MED ORDER — BETAMETHASONE SOD PHOS & ACET 6 (3-3) MG/ML IJ SUSP
12.0000 mg | Freq: Once | INTRAMUSCULAR | Status: DC
  Filled 2016-02-26: qty 2

## 2016-02-26 MED ORDER — ONDANSETRON HCL 4 MG/2ML IJ SOLN: 4.0000 mg | INTRAMUSCULAR | Status: DC | PRN

## 2016-02-26 MED ORDER — SIMETHICONE 80 MG PO CHEW
80.0000 mg | CHEWABLE_TABLET | ORAL | Status: DC | PRN
  Filled 2016-02-26: qty 1

## 2016-02-26 MED ORDER — DIBUCAINE 1 % RE OINT
1.0000 "application " | TOPICAL_OINTMENT | RECTAL | Status: DC | PRN
  Filled 2016-02-26: qty 28

## 2016-02-26 MED ORDER — SENNOSIDES-DOCUSATE SODIUM 8.6-50 MG PO TABS
2.0000 | ORAL_TABLET | ORAL | Status: DC
  Administered 2016-02-26 – 2016-02-27 (×2): 2 via ORAL
  Filled 2016-02-26 (×2): qty 2

## 2016-02-26 MED ORDER — ONDANSETRON HCL 4 MG PO TABS: 4.0000 mg | ORAL_TABLET | ORAL | Status: DC | PRN

## 2016-02-26 MED ORDER — DIPHENHYDRAMINE HCL 25 MG PO CAPS: 25.0000 mg | ORAL_CAPSULE | Freq: Four times a day (QID) | ORAL | Status: DC | PRN

## 2016-02-26 MED ORDER — COCONUT OIL OIL
1.0000 "application " | TOPICAL_OIL | Status: DC | PRN
  Administered 2016-02-27: 1 via TOPICAL
  Filled 2016-02-26 (×2): qty 120

## 2016-02-26 MED ORDER — WITCH HAZEL-GLYCERIN EX PADS
1.0000 "application " | MEDICATED_PAD | CUTANEOUS | Status: DC | PRN
  Administered 2016-02-27: 1 via TOPICAL

## 2016-02-26 MED ORDER — TETANUS-DIPHTH-ACELL PERTUSSIS 5-2.5-18.5 LF-MCG/0.5 IM SUSP: 0.5000 mL | Freq: Once | INTRAMUSCULAR | Status: DC

## 2016-02-26 MED ORDER — OXYTOCIN 40 UNITS IN LACTATED RINGERS INFUSION - SIMPLE MED
INTRAVENOUS | Status: AC
  Filled 2016-02-26: qty 1000

## 2016-02-26 NOTE — MAU Note (Signed)
Pt to be transferred up to L&D. Did not want to get up to w/c. Started taking pt up the hall on gurney. Pt stated she felt the head coming and crowning noted. Moved pt back to MAU room. Dr. Genevie AnnSchenk called for delivery. Pt had a NSVD of  Live female infant. 9/9 apgars.

## 2016-02-26 NOTE — Lactation Note (Addendum)
This note was copied from a baby's chart. Lactation Consultation Note  Patient Name: Julia Russell [id number]'YToday's Date: 02/26/2016 Reason for consult: Initial assessment;Late preterm infant;Infant < 6lbs  LPI 13 hours old. Mom reports that she had nursed the baby for 45 minutes. Then mom supplemented with 20 ml of Alimentum. Mom states that she gave 20 ml even though she knew we encouraged 10 ml because the baby seemed really hungry. Discussed supplementation guidelines and limiting total feeding time to 30 minutes. Reviewed LPI guidelines, and mom reports that she is already aware of the information. Mom agreeable to begin pumping, so she was set up. Reviewed assembly, pumping, and disassembly and cleaning. Mom reports that she always has a lot of breast milk with each of her babies, and she does want to nurse this baby. [facility]   Enc mom to nurse baby with cues and at least by 3 hours--waking as needed, and to call for assistance as needed. Enc mom to supplement with EBM/formula after each breastfeed according to supplementation guidelines. Enc mom to use DEBP after each feeding, and reviewed EBM storage guidelines. Mom states that she is sleepy, closed her eyes and stopped responding to this LC. Mom's support person, who identifies herself as the baby's godmother, stated that she was going to help the mom with pumping and feeding.   Patient's bedside RN, Selena BattenKim,  aware of assessment and interventions.   Maternal Data Has patient been taught Hand Expression?: Yes Does the patient have breastfeeding experience prior to this delivery?: Yes  Feeding Feeding Type: Formula  LATCH Score/Interventions                      Lactation Tools Discussed/Used WIC Program: Yes Pump Review: Setup, frequency, and cleaning;Milk Storage Initiated by:: JW Date initiated:: 02/26/16   Consult Status Consult Status:  Follow-up Date: 02/27/16 Follow-up type: In-patient    Sherlyn HayJennifer D Kenita Bines 02/26/2016, 4:57 PM

## 2016-02-26 NOTE — H&P (Signed)
LABOR AND DELIVERY ADMISSION HISTORY AND PHYSICAL NOTE  Julia Russell is a 25 y.o. female [phone] with IUP at 2160w5d by LMP confirmed with anatomy scna at 19 wks presenting for SOL and questionable SROM. Crist FatFern was negative in [facility]U but patient is contracting ever 3-5 minutes. Cervix is changing. Has history of preterm delivery as early as 28wks. She has been on 17-P. She has already received her betamethasone.    She reports positive fetal movement. She denies leakage of fluid or vaginal bleeding.  Prenatal History/Complications:  Past Medical History: Past Medical History:  Diagnosis Date  . Abscess     Past Surgical History: No past surgical history on file.  Obstetrical History: OB History    Gravida Para Term Preterm AB Living   5 4 2 2   4    SAB TAB Ectopic Multiple Live Births           4      Social History: Social History   Social History  . Marital status: Single    Spouse name: N/A  . Number of children: N/A  . Years of education: N/A   Social History Main Topics  . Smoking status: Former Smoker    Types: Cigarettes    Quit date: 08/13/2015  . Smokeless tobacco: Never Used  . Alcohol use No  . Drug use: No  . Sexual activity: Yes    Birth control/ protection: None   Other Topics Concern  . None   Social History Narrative  . None    Family History: Family History  Problem Relation Age of Onset  . Hypertension Mother   . Diabetes Mother   . Cancer Mother   . Cancer Maternal Grandmother   . Diabetes Maternal Grandmother     Allergies: Allergies  Allergen Reactions  . Latex Itching and Rash    Facility-Administered Medications Prior to Admission  Medication Dose Route Frequency Provider Last Rate Last Dose  . hydroxyprogesterone caproate (MAKENA) 250 mg/mL injection 250 mg  250 mg Intramuscular Weekly Rachelle A Denney, CNM   250 mg at 02/24/16 1000   Prescriptions Prior to Admission  Medication Sig Dispense Refill Last Dose  . cephALEXin  (KEFLEX) 500 MG capsule Take 1 capsule (500 mg total) by mouth 4 (four) times daily. 28 capsule 0 02/26/2016  . hydroxyprogesterone caproate (MAKENA) 250 mg/mL OIL injection Inject 250 mg into the muscle every Friday.   02/26/2016  . metroNIDAZOLE (FLAGYL) 500 MG tablet Take 1 tablet (500 mg total) by mouth 2 (two) times daily. 14 tablet 0 02/26/2016  . Prenatal Vit-Fe Phos-FA-Omega (VITAFOL GUMMIES) 3.33-0.333-34.8 MG CHEW Chew 3 each by mouth daily.   02/26/2016     Review of Systems   All systems reviewed and negative except as stated in HPI  Blood pressure 116/71, pulse 80, temperature 98.1 F (36.7 C), temperature source Oral, resp. rate 18, last menstrual period 06/21/2015. General appearance: alert, cooperative and appears stated age Lungs: clear to auscultation bilaterally Heart: regular rate and rhythm Abdomen: soft, non-tender; bowel sounds normal Extremities: No calf swelling or tenderness Presentation: cephalic by MD exam Fetal monitoring: category 1 Uterine activity: contractions every 3-5 minutes Dilation: 6 Effacement (%): 80 Station: -2, -1 Exam by:: K.SWilosn,RN   Prenatal labs: ABO, Rh: B/Positive/-- (06/08 1316) Antibody: Negative (06/08 1316) Rubella: !Error! RPR: Non Reactive (06/08 1316)  HBsAg: Negative (06/08 1316)  HIV: Non Reactive (06/08 1316)  GBS:   unknown 1 hr Glucola: not preformed Anatomy US: normal  Prenatal Transfer Tool  Maternal Diabetes: No Genetic Screening: Declined Maternal Ultrasounds/Referrals: Normal Fetal Ultrasounds or other Referrals:  None Maternal Substance Abuse:  No Significant Maternal Medications:  None Significant Maternal Lab Results: Lab values include: Other:  GBS unknown  Results for orders placed or performed during the hospital encounter of 02/26/16 (from the past 24 hour(s))  Wet prep, genital   Collection Time: 02/26/16  1:50 AM  Result Value Ref Range   Yeast Wet Prep HPF POC NONE SEEN NONE SEEN   Trich,  Wet Prep NONE SEEN NONE SEEN   Clue Cells Wet Prep HPF POC NONE SEEN NONE SEEN   WBC, Wet Prep HPF POC MODERATE (A) NONE SEEN   Sperm NONE SEEN     Patient Active Problem List   Diagnosis Date Noted  . History of preterm delivery, currently pregnant in third trimester 01/05/2016  . Supervision of high-risk pregnancy 01/05/2016    Assessment: Julia Russell is a 25 y.o. [phone] at 889w5d here for SOL  #Labor:expectant management #Pain: IV pain medications, ensure cervical change before epidural placement #FWB: Category 1 #ID:  GBS unknown given pre-term status will start PCN #MOF: breast #MOC:nexplanon #Circ:  N/A  Ernestina Pennaicholas Schenk 02/26/2016, 2:45 AM

## 2016-02-27 LAB — GC/CHLAMYDIA PROBE AMP (~~LOC~~) NOT AT ARMC
CHLAMYDIA, DNA PROBE: NEGATIVE
Neisseria Gonorrhea: NEGATIVE
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
## 2016-02-27 NOTE — Progress Notes (Signed)
Post Partum Day #1 Subjective: no complaints, up ad lib, voiding and tolerating PO  Objective: Blood pressure (!) 106/56, pulse (!) 59, temperature 98.4 F (36.9 C), temperature source Oral, resp. rate 18, last menstrual period 06/21/2015, SpO2 100 %.  Physical Exam:  General: alert, cooperative and no distress Lochia: appropriate Uterine Fundus: firm Incision: none DVT Evaluation: No evidence of DVT seen on physical exam. No cords or calf tenderness. No significant calf/ankle edema.   Recent Labs  02/26/16 0417  HGB 11.7*  HCT 35.4*    Assessment/Plan: Plan for discharge tomorrow, Breastfeeding and Contraception nexplanon planned   LOS: 1 day   Roe CoombsRachelle A Charly Holcomb, CNM 02/27/2016, 7:31 AM

## 2016-02-27 NOTE — Plan of Care (Signed)
Problem: Activity: Goal: Risk for activity intolerance will decrease Outcome: Completed/Met Date Met: 02/27/16 Patient ambulating in hallway frequently.  Problem: Education: Goal: Knowledge of condition will improve Patient complaining of perineal pain. Provided and educated patient on sitz bath and tucks pads.

## 2016-02-28 ENCOUNTER — Encounter (HOSPITAL_COMMUNITY): Payer: Self-pay | Admitting: *Deleted

## 2016-02-28 LAB — CULTURE, BETA STREP (GROUP B ONLY)

## 2016-02-28 MED ORDER — OXYCODONE-ACETAMINOPHEN 5-325 MG PO TABS: 1.0000 | ORAL_TABLET | ORAL | 0 refills | Status: DC | PRN

## 2016-02-28 MED ORDER — OXYCODONE-ACETAMINOPHEN 5-325 MG PO TABS: 1.0000 | ORAL_TABLET | ORAL | Status: DC | PRN

## 2016-02-28 MED ORDER — IBUPROFEN 600 MG PO TABS: 600.0000 mg | ORAL_TABLET | Freq: Four times a day (QID) | ORAL | 2 refills | Status: DC

## 2016-02-28 NOTE — Discharge Summary (Signed)
Obstetric Discharge Summary Reason for Admission: onset of labor at 835w0d Prenatal Procedures: NST and ultrasound Intrapartum Procedures: spontaneous vaginal delivery Postpartum Procedures: none Complications-Operative and Postpartum: none Hemoglobin  Date Value Ref Range Status  02/26/2016 11.7 (L) 12.0 - 15.0 g/dL Final   HCT  Date Value Ref Range Status  02/26/2016 35.4 (L) 36.0 - 46.0 % Final   Hematocrit  Date Value Ref Range Status  09/01/2015 36.5 34.0 - 46.6 % Final    Physical Exam:  General: alert, cooperative and no distress Lochia: appropriate Uterine Fundus: firm Incision: none DVT Evaluation: No evidence of DVT seen on physical exam. No cords or calf tenderness. No significant calf/ankle edema.  Discharge Diagnoses: Premature labor and PTD @ 6735w0d.  Discharge Information: Date: 02/28/2016 Activity: pelvic rest Diet: routine Medications: PNV, Ibuprofen and Percocet Condition: stable Instructions: refer to practice specific booklet Discharge to: home Follow-up Information    Jeff Mccallum A Denielle Bayard, CNM Follow up in 4 week(s).   Specialty:  Certified Nurse Midwife Why:  Order nexplanon.   Contact information: [address] [place] [id number] [phone]           Newborn Data: Live born female  Birth Weight: 5 lb 11.4 oz (2590 g) APGAR: 9, 9  Home with mother.  Roe Coombsachelle A Keenen Roessner, CNM 02/28/2016, 7:32 AM

## 2016-02-28 NOTE — Plan of Care (Signed)
Problem: Physical Regulation: Goal: Will remain free from infection Outcome: Progressing VSS. Afebrile. Labs being monitored. No s/sx of infection  Problem: Fluid Volume: Goal: Ability to maintain a balanced intake and output will improve Outcome: Completed/Met Date Met: 02/28/16 Pt able to void regularly - tolerating oral fluids. No IV line.

## 2016-02-28 NOTE — Progress Notes (Signed)
Post Partum Day #2 Subjective: no complaints, up ad lib, voiding and tolerating PO. Reports cramping.    Objective: Blood pressure (!) 109/52, pulse (!) 52, temperature 98.4 F (36.9 C), temperature source Oral, resp. rate 18, last menstrual period 06/21/2015, SpO2 100 %.  Physical Exam:  General: alert, cooperative and no distress Lochia: appropriate Uterine Fundus: firm Incision: none DVT Evaluation: No evidence of DVT seen on physical exam. No cords or calf tenderness. No significant calf/ankle edema.   Recent Labs  02/26/16 0417  HGB 11.7*  HCT 35.4*    Assessment/Plan: Discharge home, Breastfeeding and Contraception Nexplanon planned.   LOS: 2 days   Roe CoombsRachelle A Denney, CNM 02/28/2016, 7:27 AM

## 2016-02-28 NOTE — Lactation Note (Signed)
This note was copied from a baby's chart. Lactation Consultation Note  Patient Name: Julia Russell ZOXWR'UToday's Date: 02/28/2016 Reason for consult: Follow-up assessment;Infant < 6lbs;Late preterm infant Mom reports she feels baby is nursing well. Baby latched at this visit, assisted Mom to obtain more depth with latch, encouraged breast massage to keep baby interested. Reviewed LPT behaviors. Colostrum easily expressed. Baby nursed for about 8 minutes then came off the breast. Mom reports baby has BF just prior to my visit. Mom is supplementing with most feedings. Advised baby should be taking 20-30 ml of supplement with each feeding. Mom has not been post pumping reporting the DEBP "pulls too hard on her breast". Gave Mom Harmony Hand pump, flange 24 fits well and encouraged Mom to post pump 4-6 times per day to encourage milk production, to have EBM to supplement and protect milk supply. [facility]. She has not registered with WIC, LC encouraged Mom to call this am and get registered and make appointment. Mom reports she has phone number. Offered OP follow up next week, Mom reports she will call. Engorgement care reviewed if needed.   Maternal Data    Feeding Feeding Type: Breast Fed Length of feed: 8 min  LATCH Score/Interventions Latch: Grasps breast easily, tongue down, lips flanged, rhythmical sucking.  Audible Swallowing: A few with stimulation  Type of Nipple: Everted at rest and after stimulation  Comfort (Breast/Nipple): Soft / non-tender     Hold (Positioning): Assistance needed to correctly position infant at breast and maintain latch. Intervention(s): Breastfeeding basics reviewed;Support Pillows;Position options;Skin to skin  LATCH Score: 8  Lactation Tools Discussed/Used Tools: Pump Breast pump type: Double-Electric Breast Pump   Consult Status Consult Status: Complete Date: 02/28/16 Follow-up type: In-patient    [name],  [name] 02/28/2016, 9:04 AM

## 2016-03-02 ENCOUNTER — Encounter: Payer: Medicaid Other | Admitting: Certified Nurse Midwife

## 2016-03-09 ENCOUNTER — Encounter: Payer: Medicaid Other | Admitting: Certified Nurse Midwife

## 2016-03-27 ENCOUNTER — Ambulatory Visit: Payer: Medicaid Other | Admitting: Certified Nurse Midwife

## 2016-08-27 ENCOUNTER — Ambulatory Visit: Payer: Medicaid Other | Admitting: Certified Nurse Midwife

## 2016-10-09 ENCOUNTER — Ambulatory Visit: Payer: Medicaid Other | Admitting: Obstetrics

## 2016-10-11 ENCOUNTER — Ambulatory Visit: Payer: Medicaid Other | Admitting: Certified Nurse Midwife

## 2016-10-24 ENCOUNTER — Encounter: Payer: Self-pay | Admitting: *Deleted

## 2016-10-24 ENCOUNTER — Ambulatory Visit (INDEPENDENT_AMBULATORY_CARE_PROVIDER_SITE_OTHER): Payer: Medicaid Other | Admitting: Obstetrics

## 2016-10-24 ENCOUNTER — Encounter: Payer: Self-pay | Admitting: Obstetrics

## 2016-10-24 VITALS — BP 114/83 | HR 92 | Wt 151.8 lb

## 2016-10-24 DIAGNOSIS — Z3049 Encounter for surveillance of other contraceptives: Secondary | ICD-10-CM | POA: Diagnosis not present

## 2016-10-24 DIAGNOSIS — Z3202 Encounter for pregnancy test, result negative: Secondary | ICD-10-CM

## 2016-10-24 DIAGNOSIS — Z30017 Encounter for initial prescription of implantable subdermal contraceptive: Secondary | ICD-10-CM

## 2016-10-24 LAB — POCT URINE PREGNANCY: Preg Test, Ur: NEGATIVE

## 2016-10-24 MED ORDER — ETONOGESTREL 68 MG ~~LOC~~ IMPL: 68.0000 mg | DRUG_IMPLANT | Freq: Once | SUBCUTANEOUS | Status: AC

## 2016-10-24 NOTE — Progress Notes (Signed)
Patient is in the office for annual exam and she wants to get the nexplanon. Patient has not been active in over 1 month. Patient's cycle has started and her bleeding is heavy enough that we are going to wait until her Nexplanon recheck to do her pap/cultures.

## 2016-10-25 ENCOUNTER — Encounter: Payer: Self-pay | Admitting: Obstetrics

## 2016-10-25 NOTE — Progress Notes (Signed)
Nexplanon Procedure Note   PRE-OP DIAGNOSIS: desired long-term, reversible contraception  POST-OP DIAGNOSIS: Same  PROCEDURE: Nexplanon  placement Performing Provider: Bing Neighborsharles A. Clearance CootsHarper MD  Patient education prior to procedure, explained risk, benefits of Nexplanon, reviewed alternative options. Patient reported understanding. Gave consent to continue with procedure.   PROCEDURE:  Pregnancy Text :  Negative Site (check):      left arm         Sterile Preparation:   Betadinex3 Lot # [id number] Expiration Date 6511 / 2020  Insertion site was selected 8 - 10 cm from medial epicondyle and marked along with guiding site using sterile marker. Procedure area was prepped and draped in a sterile fashion. 1% Lidocaine 1.5 ml given prior to procedure. Nexplanon  was inserted subcutaneously.Needle was removed from the insertion site. Nexplanon capsule was palpated by provider and patient to assure satisfactory placement. Dressing applied.  Followup: The patient tolerated the procedure well without complications.  Standard post-procedure care is explained and return precautions are given.  Charles A. Clearance CootsHarper MD

## 2016-11-12 ENCOUNTER — Encounter (HOSPITAL_COMMUNITY): Payer: Self-pay | Admitting: Emergency Medicine

## 2016-11-12 ENCOUNTER — Emergency Department (HOSPITAL_COMMUNITY): Payer: Medicaid Other

## 2016-11-12 ENCOUNTER — Emergency Department (HOSPITAL_COMMUNITY)
Admission: EM | Admit: 2016-11-12 | Discharge: 2016-11-12 | Disposition: A | Discharge status: Home / Self Care | Payer: Medicaid Other | Attending: Emergency Medicine | Admitting: Emergency Medicine

## 2016-11-12 DIAGNOSIS — Z87891 Personal history of nicotine dependence: Secondary | ICD-10-CM | POA: Insufficient documentation

## 2016-11-12 DIAGNOSIS — Z91040 Latex allergy status: Secondary | ICD-10-CM | POA: Diagnosis not present

## 2016-11-12 DIAGNOSIS — R079 Chest pain, unspecified: Secondary | ICD-10-CM | POA: Diagnosis present

## 2016-11-12 LAB — CBC WITH DIFFERENTIAL/PLATELET
BASOS ABS: 0 10*3/uL (ref 0.0–0.1)
BASOS PCT: 0 %
EOS ABS: 0 10*3/uL (ref 0.0–0.7)
Eosinophils Relative: 1 %
HEMATOCRIT: 39.8 % (ref 36.0–46.0)
Hemoglobin: 13 g/dL (ref 12.0–15.0)
Lymphocytes Relative: 46 %
Lymphs Abs: 2.2 10*3/uL (ref 0.7–4.0)
MCH: 29.5 pg (ref 26.0–34.0)
MCHC: 32.7 g/dL (ref 30.0–36.0)
MCV: 90.2 fL (ref 78.0–100.0)
MONO ABS: 0.6 10*3/uL (ref 0.1–1.0)
MONOS PCT: 11 %
NEUTROS ABS: 2.1 10*3/uL (ref 1.7–7.7)
NEUTROS PCT: 42 %
Platelets: 229 10*3/uL (ref 150–400)
RBC: 4.41 MIL/uL (ref 3.87–5.11)
RDW: 14.2 % (ref 11.5–15.5)
WBC: 4.9 10*3/uL (ref 4.0–10.5)

## 2016-11-12 LAB — BASIC METABOLIC PANEL
ANION GAP: 10 (ref 5–15)
BUN: 6 mg/dL (ref 6–20)
CALCIUM: 8.9 mg/dL (ref 8.9–10.3)
CO2: 20 mmol/L — ABNORMAL LOW (ref 22–32)
CREATININE: 0.93 mg/dL (ref 0.44–1.00)
Chloride: 108 mmol/L (ref 101–111)
Glucose, Bld: 75 mg/dL (ref 65–99)
Potassium: 3.5 mmol/L (ref 3.5–5.1)
SODIUM: 138 mmol/L (ref 135–145)

## 2016-11-12 LAB — URINALYSIS, ROUTINE W REFLEX MICROSCOPIC
BILIRUBIN URINE: NEGATIVE
Glucose, UA: NEGATIVE mg/dL
HGB URINE DIPSTICK: NEGATIVE
KETONES UR: NEGATIVE mg/dL
LEUKOCYTES UA: NEGATIVE
Nitrite: NEGATIVE
Protein, ur: 30 mg/dL — AB
SPECIFIC GRAVITY, URINE: 1.025 (ref 1.005–1.030)
WBC UA: NONE SEEN WBC/hpf (ref 0–5)
pH: 6 (ref 5.0–8.0)

## 2016-11-12 LAB — I-STAT BETA HCG BLOOD, ED (MC, WL, AP ONLY)

## 2016-11-12 LAB — I-STAT TROPONIN, ED: Troponin i, poc: 0 ng/mL (ref 0.00–0.08)

## 2016-11-12 LAB — RAPID URINE DRUG SCREEN, HOSP PERFORMED
Amphetamines: NOT DETECTED
BARBITURATES: NOT DETECTED
BENZODIAZEPINES: NOT DETECTED
COCAINE: POSITIVE — AB
OPIATES: NOT DETECTED
Tetrahydrocannabinol: POSITIVE — AB

## 2016-11-12 MED ORDER — LORAZEPAM 1 MG PO TABS
1.0000 mg | ORAL_TABLET | Freq: Once | ORAL | Status: AC
  Administered 2016-11-12: 1 mg via ORAL
  Filled 2016-11-12: qty 1
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
## 2016-11-12 NOTE — Discharge Instructions (Signed)
You can take Tylenol or Ibuprofen as directed for pain.  Follow-up with her primary care doctor in the next 24-48 hours for further evaluation. If you do not have a primary care doctor you can use the one provided below.  You have been diagnosed by your caregiver as having chest wall pain.  SEEK IMMEDIATE MEDICAL ATTENTION IF:  - You develop a fever.   - Your chest pains become severe or intolerable.   - You develop new, unexplained symptoms (problems).   - You develop shortness of breath, nausea, vomiting, sweating or feel light headed.   - You developa new cough or you cough up blood

## 2016-11-12 NOTE — ED Notes (Signed)
Patient transported to X-ray 
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
## 2016-11-12 NOTE — ED Notes (Addendum)
Pt entered doc box took discharge papers and left ED. RN confirmed correct discharge paperwork. Pt refused to stay for complete discharge-

## 2017-01-01 ENCOUNTER — Emergency Department (HOSPITAL_COMMUNITY)
Admission: EM | Admit: 2017-01-01 | Discharge: 2017-01-01 | Disposition: A | Discharge status: Home / Self Care | Payer: Medicaid Other | Attending: Emergency Medicine | Admitting: Emergency Medicine

## 2017-01-01 ENCOUNTER — Encounter (HOSPITAL_COMMUNITY): Payer: Self-pay

## 2017-01-01 DIAGNOSIS — Z87891 Personal history of nicotine dependence: Secondary | ICD-10-CM | POA: Diagnosis not present

## 2017-01-01 DIAGNOSIS — Y939 Activity, unspecified: Secondary | ICD-10-CM | POA: Diagnosis not present

## 2017-01-01 DIAGNOSIS — S81852A Open bite, left lower leg, initial encounter: Secondary | ICD-10-CM | POA: Insufficient documentation

## 2017-01-01 DIAGNOSIS — Y929 Unspecified place or not applicable: Secondary | ICD-10-CM | POA: Insufficient documentation

## 2017-01-01 DIAGNOSIS — Y999 Unspecified external cause status: Secondary | ICD-10-CM | POA: Diagnosis not present

## 2017-01-01 DIAGNOSIS — Z23 Encounter for immunization: Secondary | ICD-10-CM | POA: Diagnosis not present

## 2017-01-01 DIAGNOSIS — T148XXA Other injury of unspecified body region, initial encounter: Secondary | ICD-10-CM

## 2017-01-01 MED ORDER — DOXYCYCLINE HYCLATE 100 MG PO CAPS: 100.0000 mg | ORAL_CAPSULE | Freq: Two times a day (BID) | ORAL | 0 refills | Status: AC

## 2017-01-01 MED ORDER — HYDROCODONE-ACETAMINOPHEN 5-325 MG PO TABS
1.0000 | ORAL_TABLET | Freq: Once | ORAL | Status: AC
  Administered 2017-01-01: 1 via ORAL
  Filled 2017-01-01: qty 1

## 2017-01-01 MED ORDER — KETOROLAC TROMETHAMINE 60 MG/2ML IM SOLN
60.0000 mg | Freq: Once | INTRAMUSCULAR | Status: AC
  Administered 2017-01-01: 60 mg via INTRAMUSCULAR
  Filled 2017-01-01: qty 2

## 2017-01-01 MED ORDER — TETANUS-DIPHTH-ACELL PERTUSSIS 5-2.5-18.5 LF-MCG/0.5 IM SUSP
0.5000 mL | Freq: Once | INTRAMUSCULAR | Status: AC
  Administered 2017-01-01: 0.5 mL via INTRAMUSCULAR
  Filled 2017-01-01: qty 0.5

## 2017-01-01 MED ORDER — IBUPROFEN 600 MG PO TABS: 600.0000 mg | ORAL_TABLET | Freq: Four times a day (QID) | ORAL | 0 refills | Status: DC | PRN

## 2017-01-01 MED ORDER — DOXYCYCLINE HYCLATE 100 MG PO TABS
100.0000 mg | ORAL_TABLET | Freq: Once | ORAL | Status: AC
  Administered 2017-01-01: 100 mg via ORAL
  Filled 2017-01-01: qty 1

## 2017-01-01 NOTE — ED Notes (Signed)
Declined W/C at D/C and was escorted to lobby by RN. 

## 2017-01-01 NOTE — ED Triage Notes (Signed)
Pt states she was running on Sunday, tripped and fell. Has abrasion with some bruising to the right leg. Area is not swollen or hot to touch. No hx of diabetes
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
## 2017-01-01 NOTE — Discharge Instructions (Signed)
You may remove the bandage after 24 hours. Clean the wound and surrounding area gently with tap water and mild soap. Rinse well and blot dry. Do not scrub the wound. You may shower, but avoid submerging the wound, such as with a bath or swimming. Clean the wound daily to prevent infection. Do not use cleaners such as hydrogen peroxide or alcohol.   Scar reduction: Application of a topical antibiotic ointment, such as Neosporin, after the wound has begun to close and heal well can decrease scab formation and reduce scarring. After the wound has healed, application of ointments such as Aquaphor can also reduce scar formation.  The key to scar reduction is keeping the skin well hydrated and supple. Drinking plenty of water throughout the day (At least eight 8oz glasses of water a day) is essential to staying well hydrated.  Sun exposure: Keep the wound out of the sun. After the wound has healed, continue to protect it from the sun by wearing protective clothing or applying sunscreen.  Pain: You may use Tylenol, naproxen, or ibuprofen for pain. Antiinflammatory medications: Take 600 mg of ibuprofen every 6 hours or 440 mg (over the counter dose) to 500 mg (prescription dose) of naproxen every 12 hours for the next 3 days. After this time, these medications may be used as needed for pain. Take these medications with food to avoid upset stomach. Choose only one of these medications, do not take them together. Tylenol: Should you continue to have additional pain while taking the ibuprofen or naproxen, you may add in tylenol as needed. Your daily total maximum amount of tylenol from all sources should be limited to /day for persons without liver problems, or /day for those with liver problems.  Please take all of your antibiotics until finished!   You may develop abdominal discomfort or diarrhea from the antibiotic.  You may help offset this with probiotics which you can buy or get in yogurt. Do not  eat or take the probiotics until 2 hours after your antibiotic.   Return to the ED should signs of worsening infection arise, such as spreading redness, puffiness/swelling, pus draining from the wound, severe increase in pain, fever over 100.58F, or any other major issues.

## 2018-01-02 IMAGING — US US OB COMP LESS 14 WK
1 series · 15 of 28 positions shown · non-contrast
Comparison: None.

CLINICAL DATA: Vaginal bleeding.

EXAM:
OBSTETRIC <14 WK US AND TRANSVAGINAL OB US
TECHNIQUE: Both transabdominal and transvaginal ultrasound examinations were
performed for complete evaluation of the gestation as well as the
maternal uterus, adnexal regions, and pelvic cul-de-sac.
Transvaginal technique was performed to assess early pregnancy.

[Series 1: us ob comp less 14 wk · 15 of 75 slices shown]
[im 1/75]
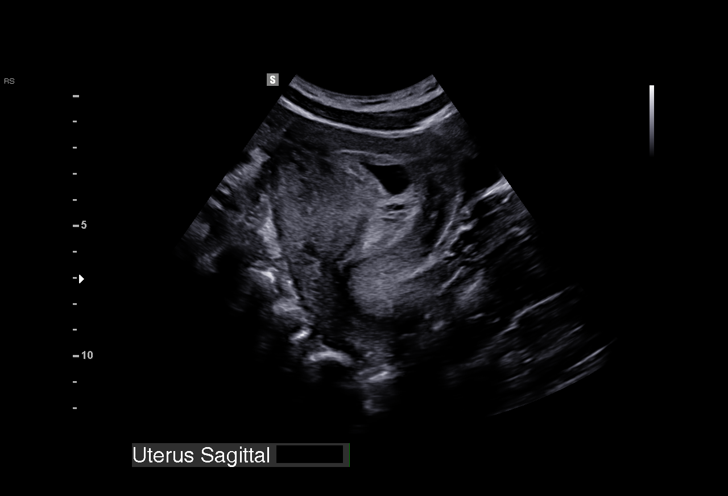
[im 6/75]
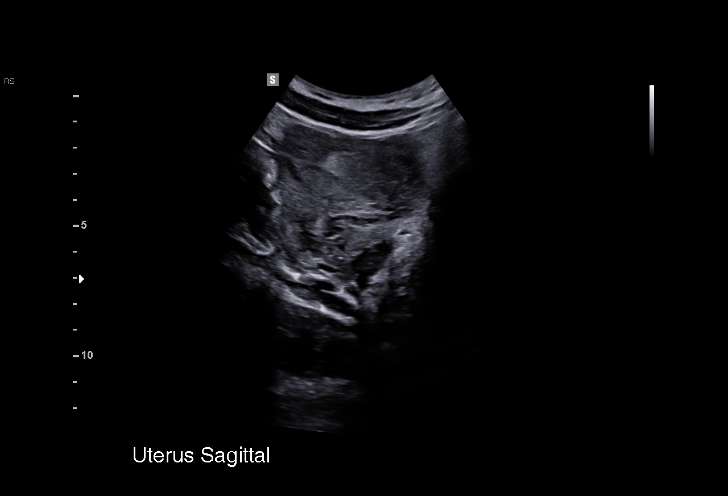
[im 11/75]
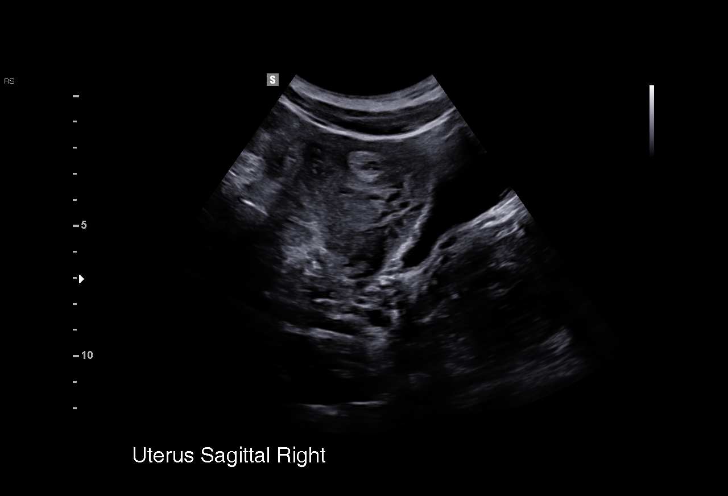
[im 17/75]
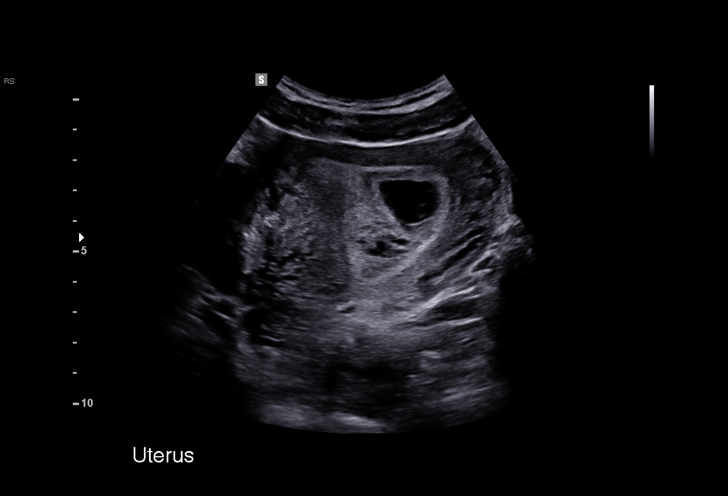
[im 22/75]
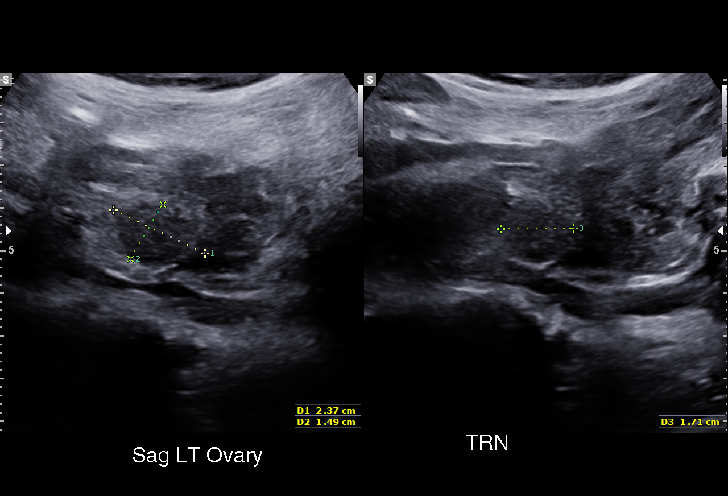
[im 28/75]
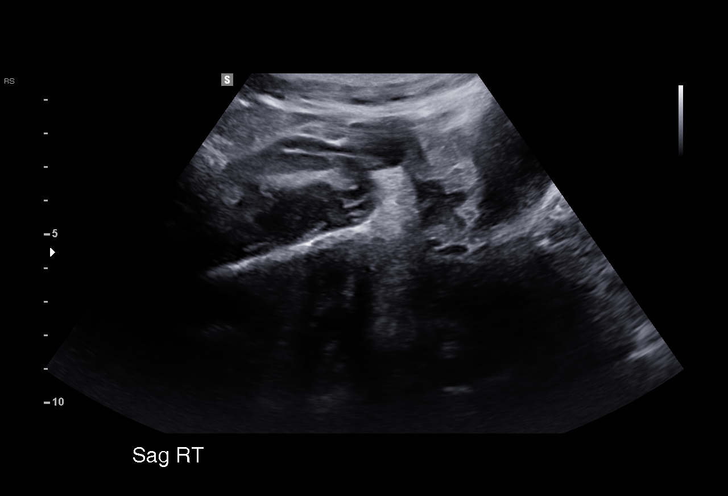
[im 33/75]
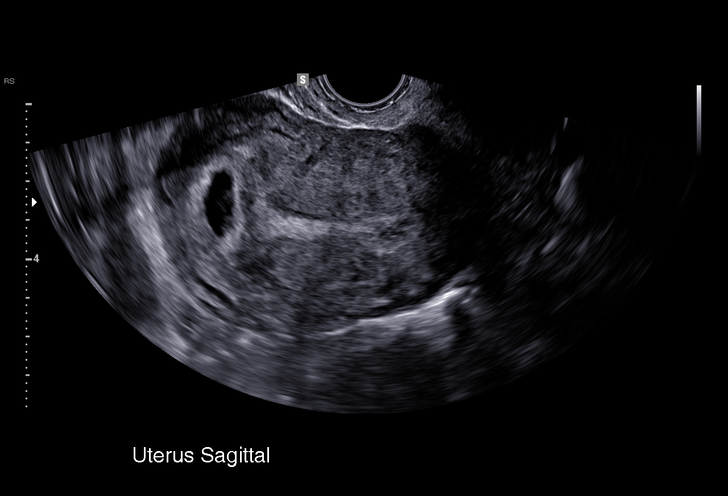
[im 39/75]
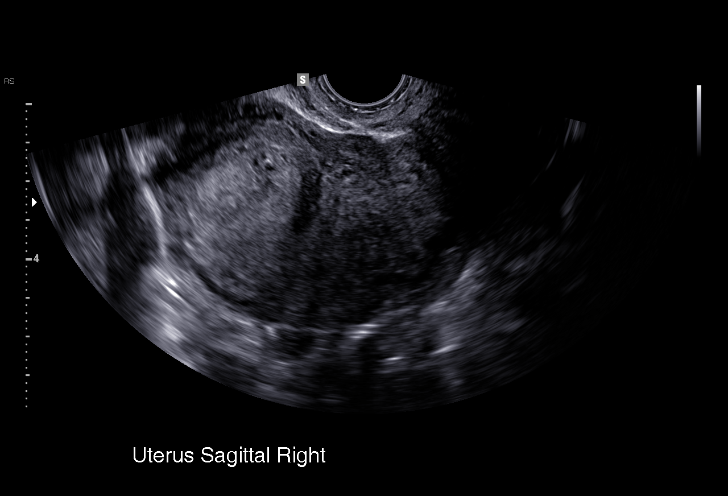
[im 42/75]
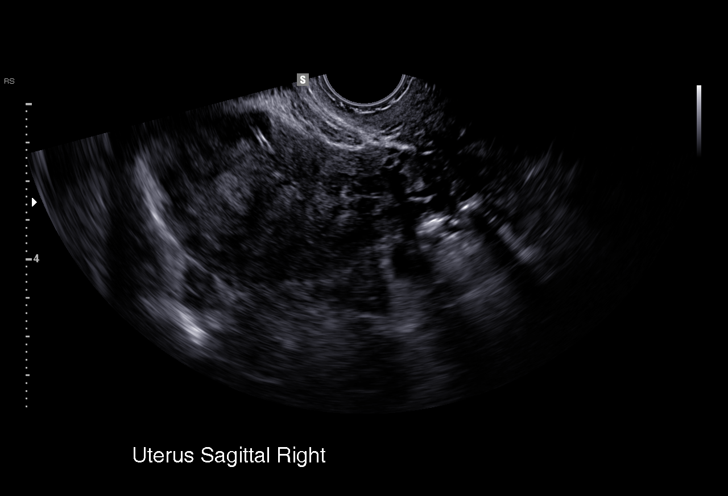
[im 47/75]
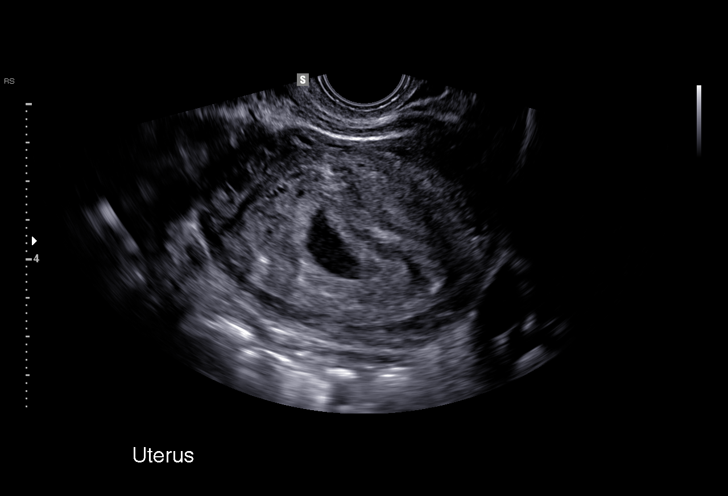
[im 53/75]
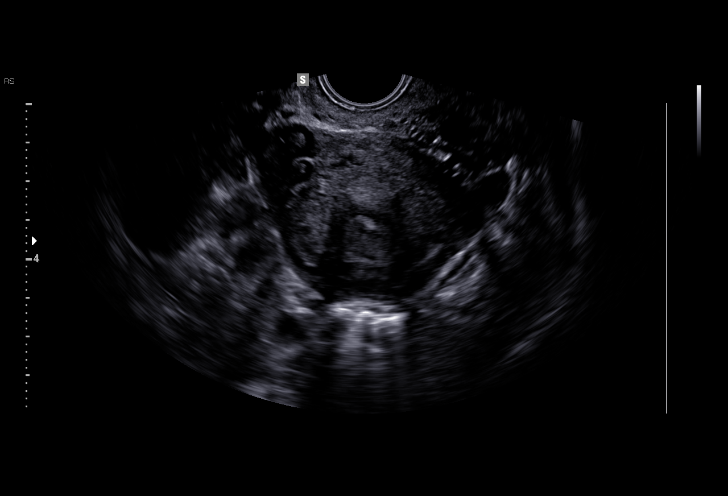
[im 58/75]
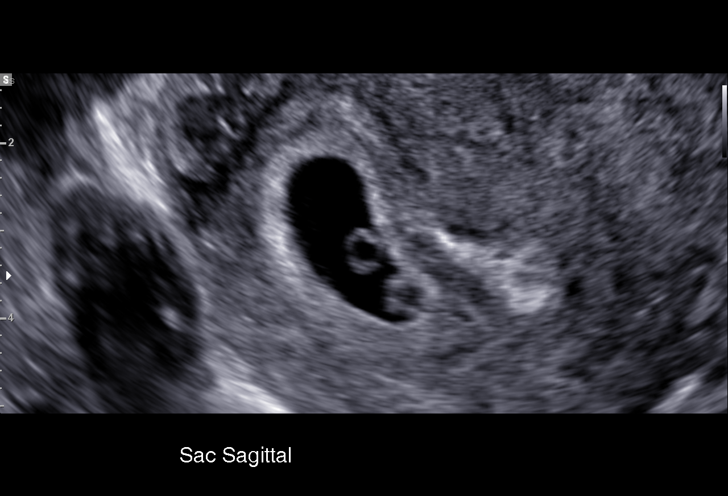
[im 64/75]
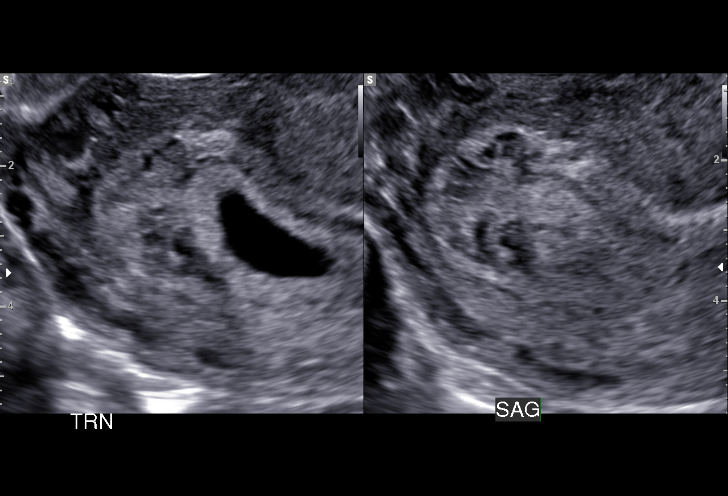
[im 69/75]
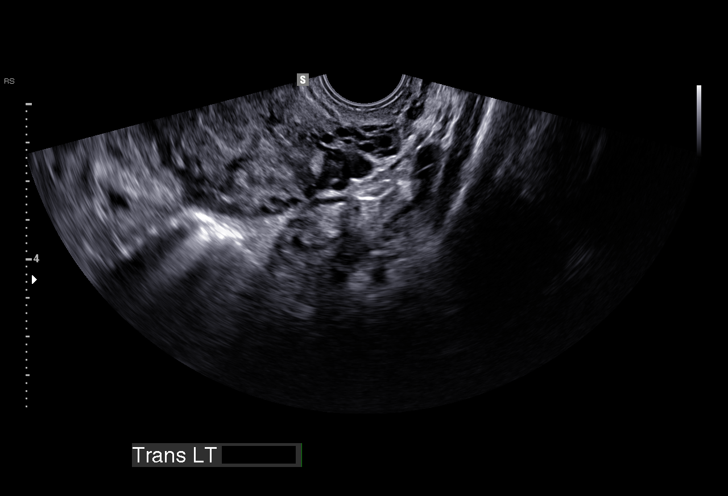
[im 75/75]
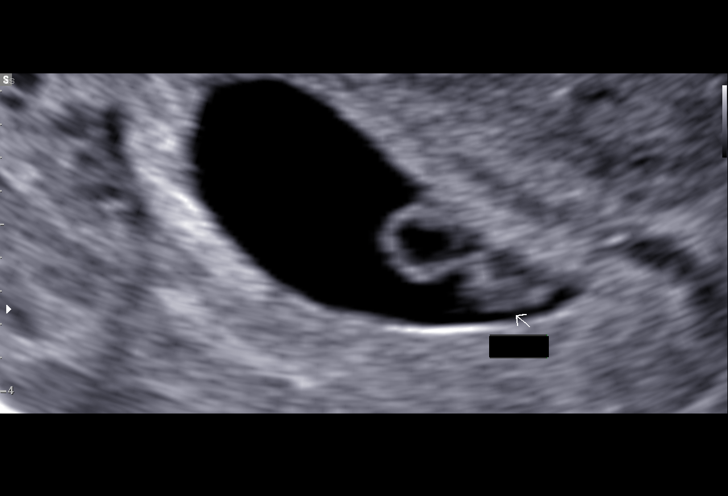

[15 of 28 positions shown; findings below may reference images not displayed]

FINDINGS: Intrauterine gestational sac: Single

Yolk sac:  Yes

Embryo:  Yes

Cardiac Activity: Yes

Heart Rate: 116  bpm

MSD:   mm    w     d

CRL:  6.3  mm   6 w   3 d                  US EDC: 03/26/2016

Subchorionic hemorrhage: Go 1.2 x 1.8 x 2.0 cm subchorionic
hemorrhage.

Maternal uterus/adnexae: Normal ovaries.  No abnormal pelvic fluid.
IMPRESSION: Single living intrauterine gestation measuring 6 weeks 3 days by
crown-rump length. Small subchorionic hemorrhage.

## 2018-05-09 IMAGING — US US MFM OB TRANSVAGINAL
1 series · 14 of 28 positions shown · non-contrast
Comparison: none

[Series 1: us mfm ob transvaginal · 14 of 57 slices shown]
[im 3/57]
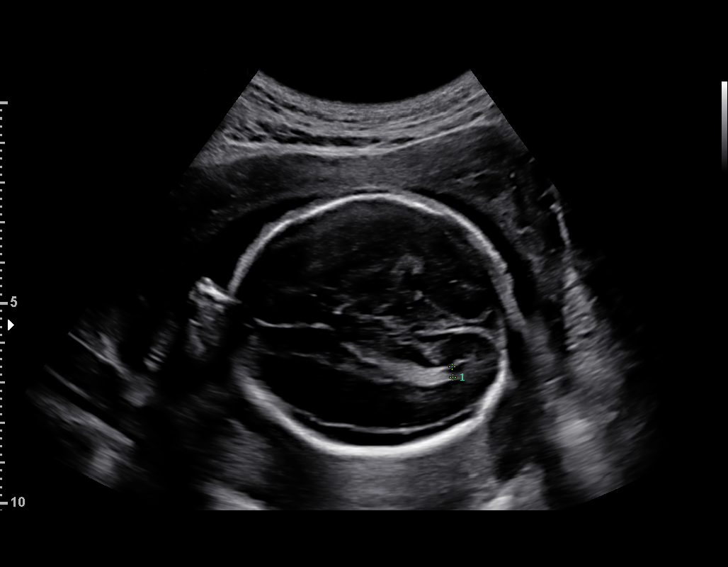
[im 7/57]
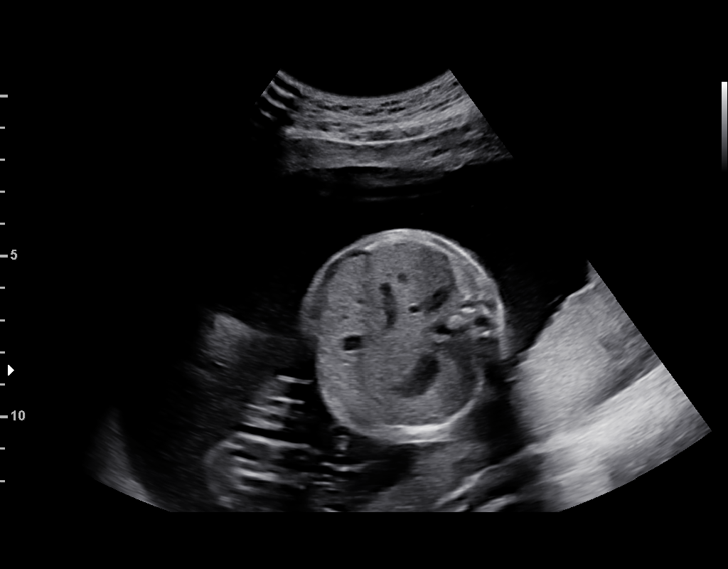
[im 11/57]
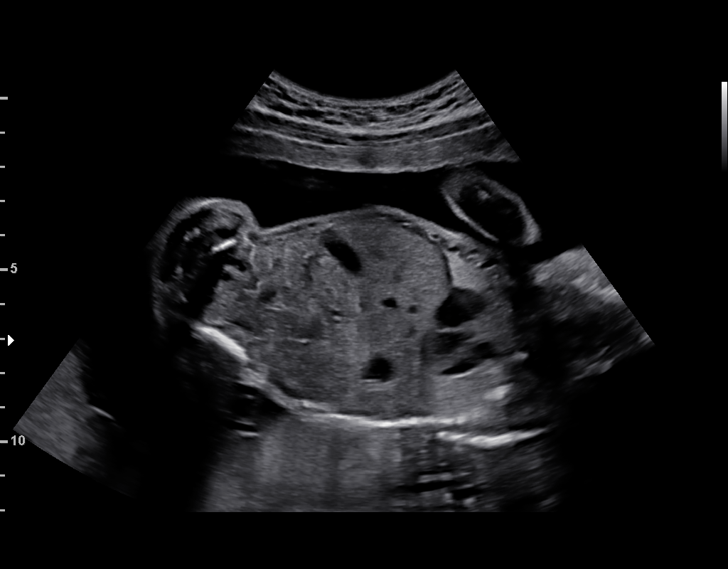
[im 15/57]
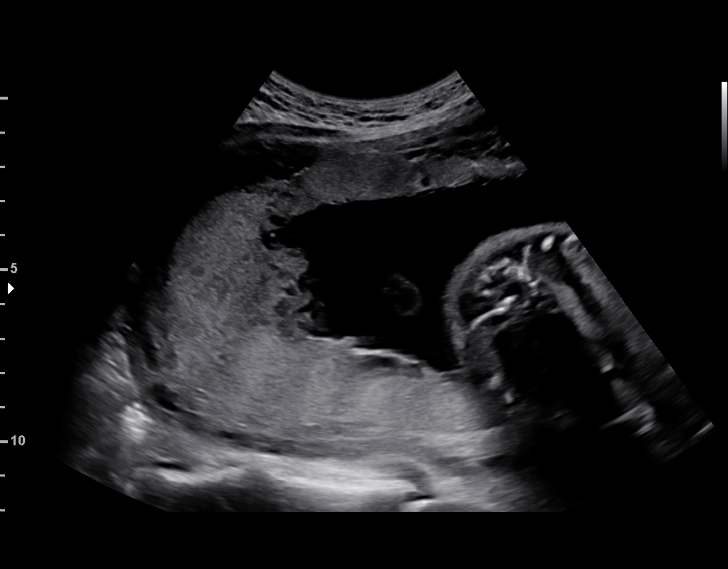
[im 19/57]
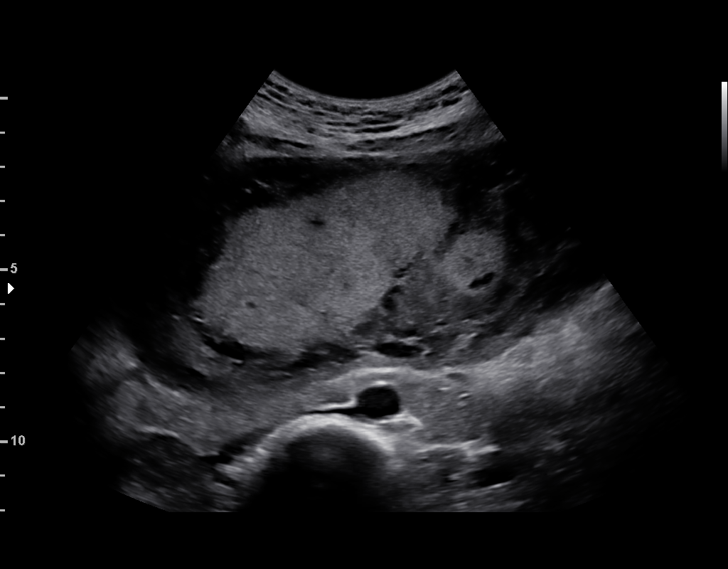
[im 23/57]
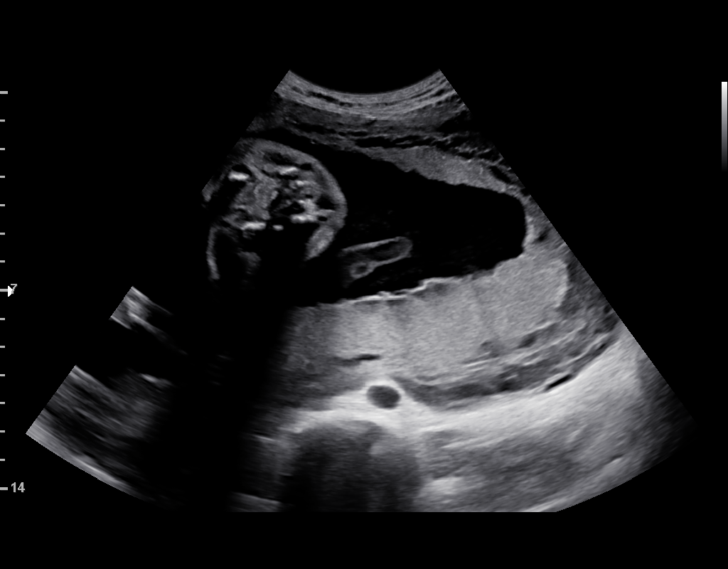
[im 27/57]
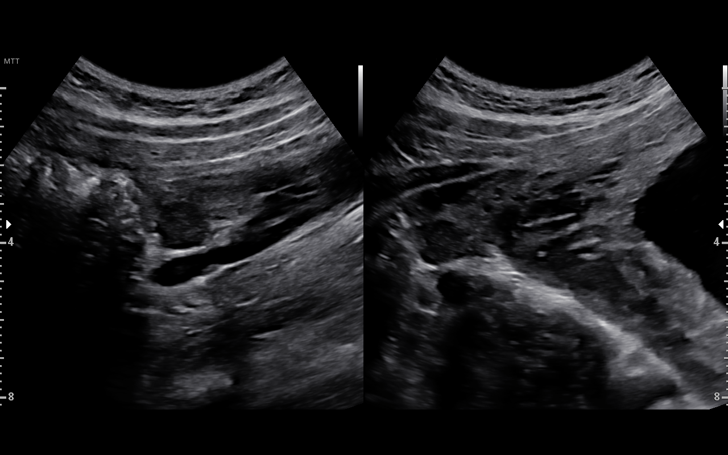
[im 32/57]
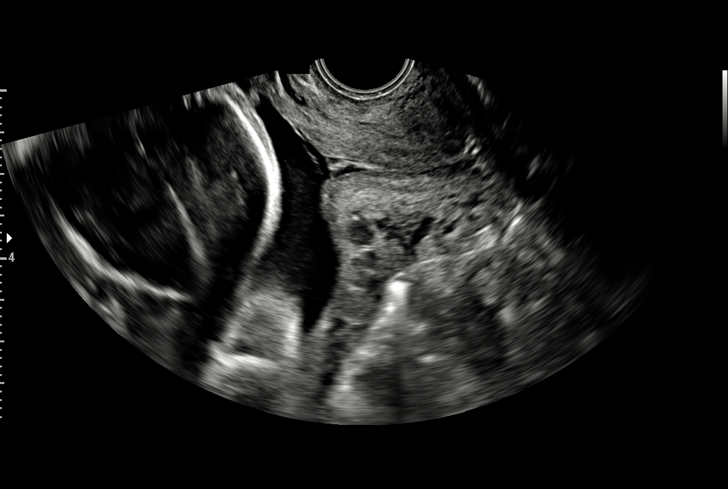
[im 36/57]
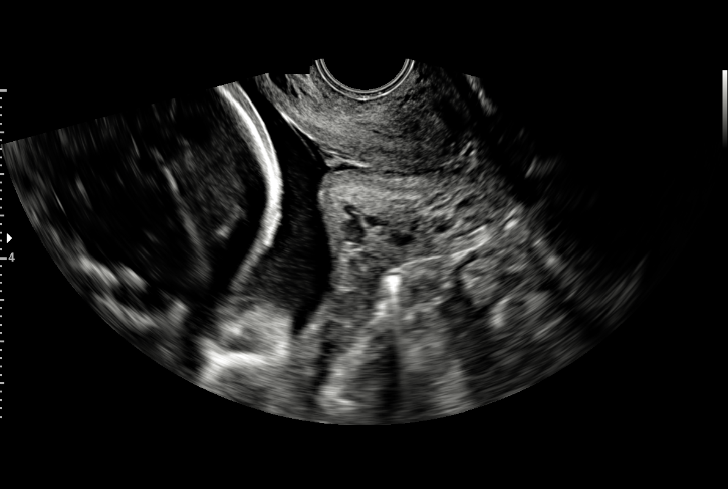
[im 40/57]
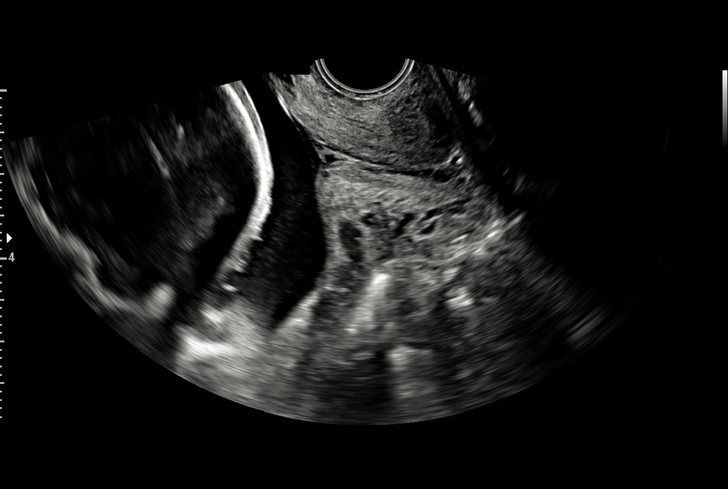
[im 44/57]
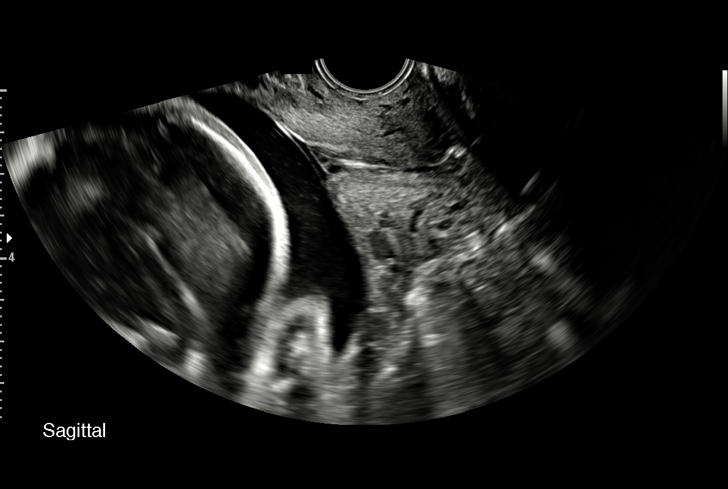
[im 48/57]
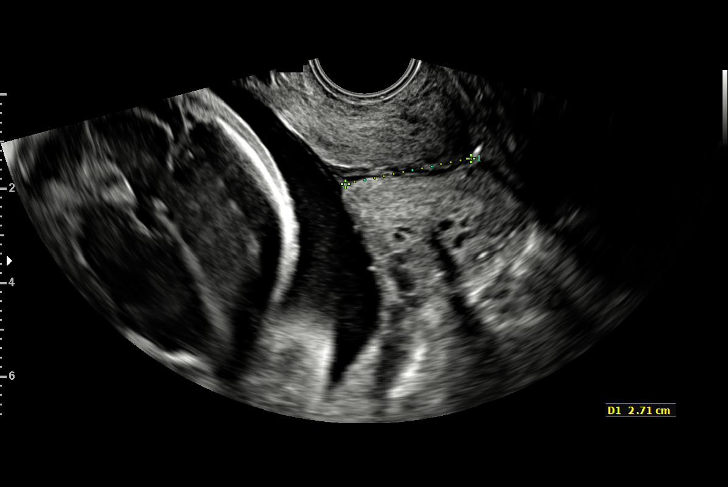
[im 52/57]
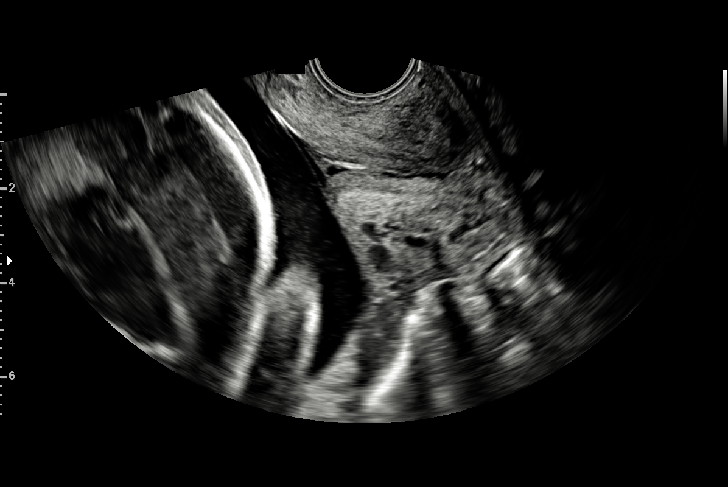
[im 57/57]
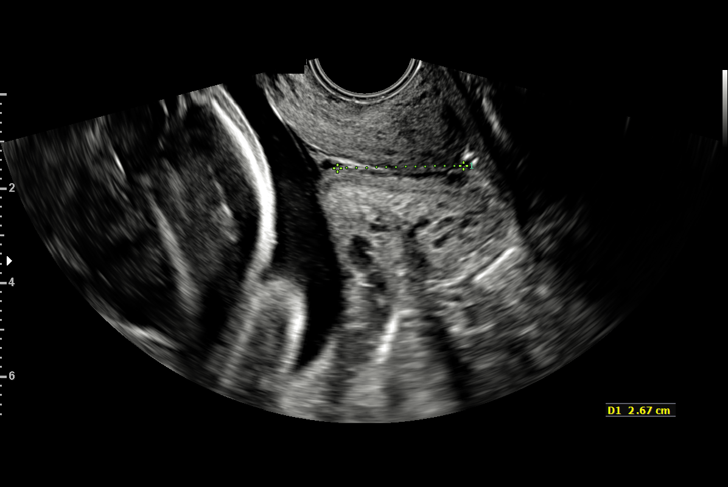

[14 of 28 positions shown; findings below may reference images not displayed]

1  RYUGO SARAGIH            [id number]      [id number]     [id number]
Indications

24 weeks gestation of pregnancy
Poor obstetric history: Previous preterm
delivery x 2 antepartum (88,86wks)
OB History

Blood Type:            Height:  5'7"   Weight (lb):  161      BMI:
Gravidity:    5         Term:   2        Prem:   2        SAB:   0
TOP:          0       Ectopic:  0        Living: 4
Fetal Evaluation

Num Of Fetuses:     1
Fetal Heart         148
Rate(bpm):
Cardiac Activity:   Observed
Presentation:       Cephalic
Placenta:           Posterior, above cervical os
P. Cord Insertion:  Previously Visualized

Amniotic Fluid
AFI FV:      Subjectively within normal limits

Largest Pocket(cm)
6.7
Gestational Age

LMP:           24w 3d       Date:   06/21/15                 EDD:   03/27/16
Best:          24w 3d    Det. By:   LMP  (06/21/15)          EDD:   03/27/16
Cervix Uterus Adnexa

Cervix
Length:            2.7  cm.
Measured transvaginally.

Uterus
No abnormality visualized.

Left Ovary
Size(cm)     2.36  x    1.72   x  0.86      Vol(ml):
Within normal limits. No adnexal mass visualized.

Right Ovary
Size(cm)     2.63  x    2.72   x  1.42      Vol(ml):
Within normal limits. No adnexal mass visualized.

Cul De Sac:   No free fluid seen.

Adnexa:       No abnormality visualized.
Impression

Single IUP at 24w 3d
Hx of preterm delivery x 2 (34, 35 weeks)
Normal amniotic fluid volume

TVUS - cervical length 2.7 -2.8 cm.  No funneling or dynamic
changes
Recommendations

Continue 17-P injections
Follow-up ultrasounds as clinically indicated.

## 2018-10-22 ENCOUNTER — Other Ambulatory Visit: Payer: Self-pay

## 2018-10-22 ENCOUNTER — Encounter (HOSPITAL_COMMUNITY): Payer: Self-pay

## 2018-10-22 ENCOUNTER — Emergency Department (HOSPITAL_COMMUNITY)
Admission: EM | Admit: 2018-10-22 | Discharge: 2018-10-22 | Disposition: A | Discharge status: Home / Self Care | Payer: Medicaid Other | Attending: Emergency Medicine | Admitting: Emergency Medicine

## 2018-10-22 DIAGNOSIS — F1610 Hallucinogen abuse, uncomplicated: Secondary | ICD-10-CM | POA: Insufficient documentation

## 2018-10-22 DIAGNOSIS — F1910 Other psychoactive substance abuse, uncomplicated: Secondary | ICD-10-CM

## 2018-10-22 DIAGNOSIS — Z91040 Latex allergy status: Secondary | ICD-10-CM | POA: Insufficient documentation

## 2018-10-22 DIAGNOSIS — Z87891 Personal history of nicotine dependence: Secondary | ICD-10-CM | POA: Diagnosis not present

## 2018-10-22 DIAGNOSIS — Z3202 Encounter for pregnancy test, result negative: Secondary | ICD-10-CM | POA: Insufficient documentation

## 2018-10-22 DIAGNOSIS — R002 Palpitations: Secondary | ICD-10-CM | POA: Diagnosis present

## 2018-10-22 LAB — I-STAT CHEM 8, ED
BUN: 12 mg/dL (ref 6–20)
Calcium, Ion: 1.12 mmol/L — ABNORMAL LOW (ref 1.15–1.40)
Chloride: 106 mmol/L (ref 98–111)
Creatinine, Ser: 0.7 mg/dL (ref 0.44–1.00)
Glucose, Bld: 74 mg/dL (ref 70–99)
HCT: 42 % (ref 36.0–46.0)
Hemoglobin: 14.3 g/dL (ref 12.0–15.0)
Potassium: 4.1 mmol/L (ref 3.5–5.1)
Sodium: 135 mmol/L (ref 135–145)
TCO2: 24 mmol/L (ref 22–32)

## 2018-10-22 LAB — I-STAT BETA HCG BLOOD, ED (MC, WL, AP ONLY): I-stat hCG, quantitative: <5 m[IU]/mL (ref <5)

## 2018-10-22 MED ORDER — KETOROLAC TROMETHAMINE 30 MG/ML IJ SOLN
30.0000 mg | Freq: Once | INTRAMUSCULAR | Status: AC
  Administered 2018-10-22: 30 mg via INTRAVENOUS
  Filled 2018-10-22: qty 1

## 2018-10-22 MED ORDER — SODIUM CHLORIDE 0.9 % IV BOLUS (SEPSIS)
1000.0000 mL | Freq: Once | INTRAVENOUS | Status: AC
  Administered 2018-10-22: 1000 mL via INTRAVENOUS

## 2018-10-22 NOTE — ED Triage Notes (Signed)
Pt arrived via [facility], states she just left her fiance and started drinking with a friend, she snorted what she thought was ecstasy at [place] but is worried it was cocaine based because he has head a headache and chest pain since. Most pain is the burning still in her nose.

## 2018-10-22 NOTE — ED Provider Notes (Signed)
TIME SEEN: 3:04 AM  CHIEF COMPLAINT: Palpitations after snorting ecstasy  HPI: Patient is a 28 year old female with no significant past medical history who presents to the emergency department with complaints of feeling like her heart was racing, pounding in her head, chest tightness after snorting ecstasy tonight and drinking a large amount of liquor.  States that her left arm started to feel funny and she was scared to go to sleep.  She called 911.  She states most of her symptoms have resolved but is still having a mild headache.  She states that she did take 500 mg of Tylenol prior to arrival with some relief.  No history of CAD, PE, DVT.  No fevers, cough, shortness of breath.  States she took this for fine and this was not an attempt to hurt herself.  ROS: See HPI Constitutional: no fever  Eyes: no drainage  ENT: no runny nose   Cardiovascular:  chest pain  Resp: no SOB  GI: no vomiting GU: no dysuria Integumentary: no rash  Allergy: no hives  Musculoskeletal: no leg swelling  Neurological: no slurred speech ROS otherwise negative  PAST MEDICAL HISTORY/PAST SURGICAL HISTORY:  Past Medical History:  Diagnosis Date  . Abscess     MEDICATIONS:  Prior to Admission medications   Medication Sig Start Date End Date Taking? Authorizing Provider  ibuprofen (ADVIL,MOTRIN) 600 MG tablet Take 1 tablet (600 mg total) by mouth every 6 (six) hours as needed. 01/01/17   Joy, Shawn C, PA-C    ALLERGIES:  Allergies  Allergen Reactions  . Latex Itching and Rash    SOCIAL HISTORY:  Social History   Tobacco Use  . Smoking status: Former Smoker    Types: Cigarettes    Quit date: 08/13/2015    Years since quitting: 3.1  . Smokeless tobacco: Never Used  Substance Use Topics  . Alcohol use: No    FAMILY HISTORY: Family History  Problem Relation Age of Onset  . Hypertension Mother   . Diabetes Mother   . Cancer Mother   . Cancer Maternal Grandmother   . Diabetes Maternal  Grandmother     EXAM: BP 124/73 (BP Location: Left Arm)   Pulse 92   Temp 98.5 F (36.9 C) (Oral)   Resp 19   SpO2 96%  CONSTITUTIONAL: Alert and oriented and responds appropriately to questions. Well-appearing; well-nourished HEAD: Normocephalic EYES: Conjunctivae clear, pupils appear equal, EOMI ENT: normal nose; moist mucous membranes NECK: Supple, no meningismus, no nuchal rigidity, no LAD  CARD: RRR; S1 and S2 appreciated; no murmurs, no clicks, no rubs, no gallops RESP: Normal chest excursion without splinting or tachypnea; breath sounds clear and equal bilaterally; no wheezes, no rhonchi, no rales, no hypoxia or respiratory distress, speaking full sentences ABD/GI: Normal bowel sounds; non-distended; soft, non-tender, no rebound, no guarding, no peritoneal signs, no hepatosplenomegaly BACK:  The back appears normal and is non-tender to palpation, there is no CVA tenderness EXT: Normal ROM in all joints; non-tender to palpation; no edema; normal capillary refill; no cyanosis, no calf tenderness or swelling    SKIN: Normal color for age and race; warm; no rash NEURO: Moves all extremities equally PSYCH: The patient's mood and manner are appropriate. Grooming and personal hygiene are appropriate.  MEDICAL DECISION MAKING: Patient here with palpitations, headache, chest tightness after snorting ecstasy.  Heart rate is now 70s in normal sinus rhythm without arrhythmia, interval changes, ischemia.  Doubt CAD, dissection, PE.  Doubt pneumothorax, pneumonia, COVID.  Symptoms  likely related to drug use.  Will treat with IV fluids, Toradol.  Will check hemoglobin, basic electrolytes and pregnancy test.  ED PROGRESS: Patient reports feeling better.  Her labs are unremarkable.  She has a sober driver to take her home.  Will discharge.  Have advised her to avoid illicit drug use in the future.  At this time, I do not feel there is any life-threatening condition present. I have reviewed and  discussed all results (EKG, imaging, lab, urine as appropriate) and exam findings with patient/family. I have reviewed nursing notes and appropriate previous records.  I feel the patient is safe to be discharged home without further emergent workup and can continue workup as an outpatient as needed. Discussed usual and customary return precautions. Patient/family verbalize understanding and are comfortable with this plan.  Outpatient follow-up has been provided as needed. All questions have been answered.      EKG Interpretation  Date/Time:  Wednesday October 22 2018 02:38:36 EDT Ventricular Rate:  83 PR Interval:    QRS Duration: 83 QT Interval:  367 QTC Calculation: 432 R Axis:   5 Text Interpretation:  Sinus rhythm Probable left atrial enlargement Borderline low voltage, extremity leads ST elev, probable normal early repol pattern No significant change since last tracing Confirmed by Ward, Cyril Mourning [phone]) on 10/22/2018 3:02:04 AM         Ward, Delice Bison, DO 10/22/18 7001

## 2020-12-13 ENCOUNTER — Ambulatory Visit: Payer: Medicaid Other | Admitting: Obstetrics

## 2020-12-20 ENCOUNTER — Ambulatory Visit: Payer: Medicaid Other | Admitting: Obstetrics

## 2021-01-10 ENCOUNTER — Ambulatory Visit: Payer: Medicaid Other | Admitting: Obstetrics

## 2021-05-08 ENCOUNTER — Ambulatory Visit: Payer: Medicaid Other | Admitting: Obstetrics and Gynecology

## 2021-08-03 ENCOUNTER — Emergency Department (HOSPITAL_COMMUNITY): Payer: Medicaid Other

## 2021-08-03 ENCOUNTER — Encounter (HOSPITAL_COMMUNITY): Payer: Self-pay

## 2021-08-03 ENCOUNTER — Other Ambulatory Visit: Payer: Self-pay

## 2021-08-03 ENCOUNTER — Emergency Department (HOSPITAL_COMMUNITY)
Admission: EM | Admit: 2021-08-03 | Discharge: 2021-08-03 | Disposition: A | Discharge status: Home / Self Care | Payer: Medicaid Other | Attending: Emergency Medicine | Admitting: Emergency Medicine

## 2021-08-03 DIAGNOSIS — R002 Palpitations: Secondary | ICD-10-CM | POA: Diagnosis not present

## 2021-08-03 DIAGNOSIS — N9489 Other specified conditions associated with female genital organs and menstrual cycle: Secondary | ICD-10-CM | POA: Diagnosis not present

## 2021-08-03 DIAGNOSIS — R0602 Shortness of breath: Secondary | ICD-10-CM | POA: Insufficient documentation

## 2021-08-03 DIAGNOSIS — R42 Dizziness and giddiness: Secondary | ICD-10-CM | POA: Diagnosis not present

## 2021-08-03 DIAGNOSIS — R519 Headache, unspecified: Secondary | ICD-10-CM | POA: Insufficient documentation

## 2021-08-03 DIAGNOSIS — R55 Syncope and collapse: Secondary | ICD-10-CM | POA: Insufficient documentation

## 2021-08-03 DIAGNOSIS — R0789 Other chest pain: Secondary | ICD-10-CM | POA: Insufficient documentation

## 2021-08-03 LAB — CBC WITH DIFFERENTIAL/PLATELET
Abs Immature Granulocytes: 0.05 10*3/uL (ref 0.00–0.07)
Basophils Absolute: 0 10*3/uL (ref 0.0–0.1)
Basophils Relative: 0 %
Eosinophils Absolute: 0 10*3/uL (ref 0.0–0.5)
Eosinophils Relative: 0 %
HCT: 38.9 % (ref 36.0–46.0)
Hemoglobin: 13.1 g/dL (ref 12.0–15.0)
Immature Granulocytes: 0 %
Lymphocytes Relative: 23 %
Lymphs Abs: 2.8 10*3/uL (ref 0.7–4.0)
MCH: 31.3 pg (ref 26.0–34.0)
MCHC: 33.7 g/dL (ref 30.0–36.0)
MCV: 92.8 fL (ref 80.0–100.0)
Monocytes Absolute: 1.3 10*3/uL — ABNORMAL HIGH (ref 0.1–1.0)
Monocytes Relative: 10 %
Neutro Abs: 7.9 10*3/uL — ABNORMAL HIGH (ref 1.7–7.7)
Neutrophils Relative %: 67 %
Platelets: 249 10*3/uL (ref 150–400)
RBC: 4.19 MIL/uL (ref 3.87–5.11)
RDW: 12.5 % (ref 11.5–15.5)
WBC: 12 10*3/uL — ABNORMAL HIGH (ref 4.0–10.5)
nRBC: 0 % (ref 0.0–0.2)

## 2021-08-03 LAB — I-STAT BETA HCG BLOOD, ED (MC, WL, AP ONLY): I-stat hCG, quantitative: <5 m[IU]/mL (ref <5)

## 2021-08-03 LAB — URINALYSIS, ROUTINE W REFLEX MICROSCOPIC
Bilirubin Urine: NEGATIVE
Glucose, UA: NEGATIVE mg/dL
Hgb urine dipstick: NEGATIVE
Ketones, ur: 80 mg/dL — AB
Leukocytes,Ua: NEGATIVE
Nitrite: NEGATIVE
Protein, ur: NEGATIVE mg/dL
Specific Gravity, Urine: 1.013 (ref 1.005–1.030)
pH: 6 (ref 5.0–8.0)

## 2021-08-03 LAB — BASIC METABOLIC PANEL
Anion gap: 14 (ref 5–15)
Anion gap: 12 (ref 5–15)
BUN: 11 mg/dL (ref 6–20)
BUN: 10 mg/dL (ref 6–20)
CO2: 16 mmol/L — ABNORMAL LOW (ref 22–32)
CO2: 19 mmol/L — ABNORMAL LOW (ref 22–32)
Calcium: 9.1 mg/dL (ref 8.9–10.3)
Calcium: 8.9 mg/dL (ref 8.9–10.3)
Chloride: 105 mmol/L (ref 98–111)
Chloride: 106 mmol/L (ref 98–111)
Creatinine, Ser: 0.73 mg/dL (ref 0.44–1.00)
Creatinine, Ser: 0.84 mg/dL (ref 0.44–1.00)
GFR, Estimated: >60 mL/min (ref >60)
GFR, Estimated: >60 mL/min (ref >60)
Glucose, Bld: 74 mg/dL (ref 70–99)
Glucose, Bld: 65 mg/dL — ABNORMAL LOW (ref 70–99)
Potassium: 3.8 mmol/L (ref 3.5–5.1)
Potassium: 3.7 mmol/L (ref 3.5–5.1)
Sodium: 135 mmol/L (ref 135–145)
Sodium: 137 mmol/L (ref 135–145)

## 2021-08-03 LAB — TROPONIN I (HIGH SENSITIVITY)
Troponin I (High Sensitivity): 4 ng/L (ref <18)
Troponin I (High Sensitivity): 3 ng/L (ref <18)

## 2021-08-03 LAB — RAPID URINE DRUG SCREEN, HOSP PERFORMED
Amphetamines: NOT DETECTED
Barbiturates: NOT DETECTED
Benzodiazepines: NOT DETECTED
Cocaine: POSITIVE — AB
Opiates: NOT DETECTED
Tetrahydrocannabinol: POSITIVE — AB

## 2021-08-03 LAB — D-DIMER, QUANTITATIVE: D-Dimer, Quant: <0.27 ug/mL-FEU (ref 0.00–0.50)

## 2021-08-03 MED ORDER — LIDOCAINE VISCOUS HCL 2 % MT SOLN
15.0000 mL | Freq: Once | OROMUCOSAL | Status: AC
Start: 2021-08-03 — End: 2021-08-03
  Administered 2021-08-03: 15 mL via ORAL
  Filled 2021-08-03: qty 15

## 2021-08-03 MED ORDER — LORAZEPAM 1 MG PO TABS
1.0000 mg | ORAL_TABLET | Freq: Once | ORAL | Status: AC
  Administered 2021-08-03: 1 mg via ORAL
  Filled 2021-08-03: qty 1

## 2021-08-03 MED ORDER — SODIUM CHLORIDE 0.9 % IV BOLUS
1000.0000 mL | Freq: Once | INTRAVENOUS | Status: AC
  Administered 2021-08-03: 1000 mL via INTRAVENOUS

## 2021-08-03 MED ORDER — ALUM & MAG HYDROXIDE-SIMETH 200-200-20 MG/5ML PO SUSP
30.0000 mL | Freq: Once | ORAL | Status: AC
  Administered 2021-08-03: 30 mL via ORAL
  Filled 2021-08-03: qty 30

## 2021-08-03 MED ORDER — SODIUM CHLORIDE 0.9 % IV BOLUS: 1000.0000 mL | Freq: Once | INTRAVENOUS | Status: DC

## 2021-08-03 NOTE — ED Provider Notes (Signed)
?[facility]-EMERGENCY DEPT ?Provider Note ? ? ?CSN: [id number] ?Arrival date & time: 08/03/21  0901 ? ?  ? ?History ? ?Chief Complaint  ?Patient presents with  ? Chest Pain  ? Loss of Consciousness  ? ? ?Julia Russell is a 31 y.o. female. ? ?Patient presents via EMS with chest pain, palpitations and anxious feeling.  She states use "four bumps" of cocaine last night.  While smoking marijuana this morning she developed tightness in her chest, hyperventilation, nausea, cold sweats and dizziness.  Symptoms have been ongoing for the past 2 hours since EMS was called.  She did have a syncopal episode on EMS arrival with hyperventilation but did not hit her head or lose consciousness.  Complains of tightness and pressure to the center of her chest and palpitations and shortness of breath.  Denies any abdominal pain.  Denies any radiation of her chest pain to her arms, back or neck.  Denies any pain with urination or blood in the urine.  Denies any known cardiac history.  Admits to using marijuana tonight and cocaine last night. ?Denies any known history of DVT or PE ?Did develop headache earlier today in her forehead which is improving.  Denies thunderclap onset.  Denies hitting her head when she passed out.  Denies sudden onset headache in setting of syncope ? ?The history is provided by the patient and the EMS personnel.  ?Chest Pain ?Associated symptoms: headache, shortness of breath and syncope   ?Associated symptoms: no abdominal pain, no dizziness, no fever, no nausea, no vomiting and no weakness   ?Loss of Consciousness ?Associated symptoms: chest pain, headaches and shortness of breath   ?Associated symptoms: no dizziness, no fever, no nausea, no vomiting and no weakness   ? ?  ? ?Home Medications ?Prior to Admission medications   ?Medication Sig Start Date End Date Taking? Authorizing Provider  ?ibuprofen (ADVIL,MOTRIN) 600 MG tablet Take 1 tablet (600 mg total) by mouth every 6 (six) hours  as needed. 01/01/17   Joy, Hillard Danker, PA-C  ?   ? ?Allergies    ?Latex   ? ?Review of Systems   ?Review of Systems  ?Constitutional:  Negative for activity change, appetite change and fever.  ?HENT:  Negative for congestion and rhinorrhea.   ?Respiratory:  Positive for chest tightness and shortness of breath.   ?Cardiovascular:  Positive for chest pain and syncope.  ?Gastrointestinal:  Negative for abdominal pain, nausea and vomiting.  ?Genitourinary:  Negative for dysuria and hematuria.  ?Musculoskeletal:  Negative for arthralgias and myalgias.  ?Neurological:  Positive for syncope and headaches. Negative for dizziness and weakness.  ? all other systems are negative except as noted in the HPI and PMH.  ? ?Physical Exam ?Updated Vital Signs ?BP 113/65 (BP Location: Left Arm)   Pulse 84   Temp 98.3 ?F (36.8 ?C) (Oral)   Resp 16   Ht 5\' 4"  (1.626 m)   Wt 74.8 kg   LMP 07/30/2021   SpO2 100%   BMI 28.31 kg/m?  ?Physical Exam ?Vitals and nursing note reviewed.  ?Constitutional:   ?   General: She is not in acute distress. ?   Appearance: She is well-developed.  ?HENT:  ?   Head: Normocephalic and atraumatic.  ?   Mouth/Throat:  ?   Pharynx: No oropharyngeal exudate.  ?Eyes:  ?   Conjunctiva/sclera: Conjunctivae normal.  ?   Pupils: Pupils are equal, round, and reactive to light.  ?Neck:  ?  Comments: No meningismus. ?Cardiovascular:  ?   Rate and Rhythm: Normal rate and regular rhythm.  ?   Heart sounds: Normal heart sounds. No murmur heard. ?Pulmonary:  ?   Effort: Pulmonary effort is normal. No respiratory distress.  ?   Breath sounds: Normal breath sounds.  ?Chest:  ?   Chest wall: Tenderness present.  ?Abdominal:  ?   Palpations: Abdomen is soft.  ?   Tenderness: There is no abdominal tenderness. There is no guarding or rebound.  ?Musculoskeletal:     ?   General: No tenderness. Normal range of motion.  ?   Cervical back: Normal range of motion and neck supple.  ?Skin: ?   General: Skin is warm.   ?Neurological:  ?   General: No focal deficit present.  ?   Mental Status: She is alert and oriented to person, place, and time. Mental status is at baseline.  ?   Cranial Nerves: No cranial nerve deficit.  ?   Motor: No abnormal muscle tone.  ?   Coordination: Coordination normal.  ?   Comments:  5/5 strength throughout. CN 2-12 intact.Equal grip strength.   ?Psychiatric:     ?   Behavior: Behavior normal.  ? ? ?ED Results / Procedures / Treatments   ?Labs ?(all labs ordered are listed, but only abnormal results are displayed) ?Labs Reviewed  ?CBC WITH DIFFERENTIAL/PLATELET - Abnormal; Notable for the following components:  ?    Result Value  ? WBC 12.0 (*)   ? Neutro Abs 7.9 (*)   ? Monocytes Absolute 1.3 (*)   ? All other components within normal limits  ?BASIC METABOLIC PANEL - Abnormal; Notable for the following components:  ? CO2 16 (*)   ? All other components within normal limits  ?RAPID URINE DRUG SCREEN, HOSP PERFORMED - Abnormal; Notable for the following components:  ? Cocaine POSITIVE (*)   ? Tetrahydrocannabinol POSITIVE (*)   ? All other components within normal limits  ?URINALYSIS, ROUTINE W REFLEX MICROSCOPIC - Abnormal; Notable for the following components:  ? Ketones, ur 80 (*)   ? All other components within normal limits  ?BASIC METABOLIC PANEL - Abnormal; Notable for the following components:  ? CO2 19 (*)   ? Glucose, Bld 65 (*)   ? All other components within normal limits  ?D-DIMER, QUANTITATIVE  ?I-STAT BETA HCG BLOOD, ED (MC, WL, AP ONLY)  ?TROPONIN I (HIGH SENSITIVITY)  ?TROPONIN I (HIGH SENSITIVITY)  ? ? ?EKG ?EKG Interpretation ? ?Date/Time:  Thursday Aug 03 2021 09:09:57 EDT ?Ventricular Rate:  94 ?PR Interval:  166 ?QRS Duration: 78 ?QT Interval:  366 ?QTC Calculation: 458 ?R Axis:   -16 ?Text Interpretation: Sinus rhythm Left atrial enlargement Borderline left axis deviation Low voltage, precordial leads No significant change since prior 7/20 Confirmed by Meridee Score [phone]) on  08/03/2021 9:14:28 AM ? ?Radiology ?DG Chest 2 View ? ?Result Date: 08/03/2021 ?CLINICAL DATA:  Chest pain EXAM: CHEST - 2 VIEW COMPARISON:  11/20/2016 FINDINGS: The heart size and mediastinal contours are within normal limits. Both lungs are clear. The visualized skeletal structures are unremarkable. IMPRESSION: No active cardiopulmonary disease. Electronically Signed   By: Ernie Avena M.D.   On: 08/03/2021 09:41  ? ?CT Head Wo Contrast ? ?Result Date: 08/03/2021 ?CLINICAL DATA:  Headache, new or worsening, neuro deficit (Age 74-49y) EXAM: CT HEAD WITHOUT CONTRAST TECHNIQUE: Contiguous axial images were obtained from the base of the skull through the vertex without intravenous contrast. RADIATION  DOSE REDUCTION: This exam was performed according to the departmental dose-optimization program which includes automated exposure control, adjustment of the mA and/or kV according to patient size and/or use of iterative reconstruction technique. COMPARISON:  None Available. FINDINGS: Brain: No evidence of acute intracranial hemorrhage or extra-axial collection.No evidence of mass lesion/concerning mass effect.The ventricles are normal in size. Vascular: No hyperdense vessel or unexpected calcification. Skull: Normal. Negative for fracture or focal lesion. Sinuses/Orbits: Mucous retention cyst in the right maxillary sinus and frontal sinus. Other: None. IMPRESSION: No acute intracranial abnormality. Electronically Signed   By: Caprice RenshawJacob  Kahn M.D.   On: 08/03/2021 12:23   ? ?Procedures ?Procedures  ? ? ?Medications Ordered in ED ?Medications  ?LORazepam (ATIVAN) tablet 1 mg (1 mg Oral Given 08/03/21 1119)  ?alum & mag hydroxide-simeth (MAALOX/MYLANTA) 200-200-20 MG/5ML suspension 30 mL (30 mLs Oral Given 08/03/21 1119)  ?  And  ?lidocaine (XYLOCAINE) 2 % viscous mouth solution 15 mL (15 mLs Oral Given 08/03/21 1119)  ? ? ?ED Course/ Medical Decision Making/ A&P ?  ?                        ?Palpitations, cold sweats, chest  pressure since using cocaine last night.  Chest pain started this morning.  EKG without acute ST elevation. ?Did have syncopal episode in setting of hyperventilation for EMS ? ?EKG shows no acute ST elevations. ?Her pain is

## 2021-08-03 NOTE — ED Notes (Signed)
Provided pt with saltine crackers and a gingerale ?

## 2021-08-03 NOTE — Discharge Instructions (Signed)
There is no evidence of heart attack or blood clot in the lung.  You should stop using cocaine and any other substances.  Follow-up with your primary doctor.  Return to the ED with exertional chest pain, pain associate with shortness of breath, nausea, vomiting, sweating, any other concerns ?

## 2021-08-03 NOTE — ED Triage Notes (Signed)
Per EMS- Patient c/o mid chest pain x 2 hours ago. Patient reports a lot of stress since her father died. ?Patient also reports using cocaine and drinking beer last night. ?EMS reports that the patient was hyperventilating while loading onto the ambulance which caused her to pass out on the grass. EKG- NSR. ?

## 2021-08-03 NOTE — ED Notes (Signed)
Writer went in to triage this patient, but is on the cell phone with the speaker on, yelling and screaming at someone to go pick her kids up.  ?Writer tried to interrupt and tell the patient that her triage needed to be completed and when she was finished with the phone, then I would triage. ?

## 2021-08-03 NOTE — ED Notes (Signed)
Pt ambulated to the bathroom and back.  Pt had the tendency to walk sideways and said she shouldn't be feeling this way at 30. ?

## 2021-11-23 ENCOUNTER — Encounter (HOSPITAL_COMMUNITY): Payer: Self-pay | Admitting: Emergency Medicine

## 2021-11-23 ENCOUNTER — Other Ambulatory Visit: Payer: Self-pay

## 2021-11-23 ENCOUNTER — Emergency Department (HOSPITAL_COMMUNITY)
Admission: EM | Admit: 2021-11-23 | Discharge: 2021-11-23 | Disposition: A | Discharge status: Home / Self Care | Payer: Medicaid Other | Attending: Emergency Medicine | Admitting: Emergency Medicine

## 2021-11-23 DIAGNOSIS — J039 Acute tonsillitis, unspecified: Secondary | ICD-10-CM | POA: Diagnosis not present

## 2021-11-23 DIAGNOSIS — Z91040 Latex allergy status: Secondary | ICD-10-CM | POA: Insufficient documentation

## 2021-11-23 DIAGNOSIS — J029 Acute pharyngitis, unspecified: Secondary | ICD-10-CM | POA: Diagnosis present

## 2021-11-23 DIAGNOSIS — Z20822 Contact with and (suspected) exposure to covid-19: Secondary | ICD-10-CM | POA: Diagnosis not present

## 2021-11-23 LAB — GROUP A STREP BY PCR: Group A Strep by PCR: NOT DETECTED

## 2021-11-23 LAB — RESP PANEL BY RT-PCR (FLU A&B, COVID) ARPGX2
Influenza A by PCR: NEGATIVE
Influenza B by PCR: NEGATIVE
SARS Coronavirus 2 by RT PCR: NEGATIVE

## 2021-11-23 MED ORDER — DEXAMETHASONE SODIUM PHOSPHATE 10 MG/ML IJ SOLN
10.0000 mg | Freq: Once | INTRAMUSCULAR | Status: AC
  Administered 2021-11-23: 10 mg via INTRAMUSCULAR
  Filled 2021-11-23: qty 1

## 2021-11-23 MED ORDER — KETOROLAC TROMETHAMINE 15 MG/ML IJ SOLN
15.0000 mg | Freq: Once | INTRAMUSCULAR | Status: AC
  Administered 2021-11-23: 15 mg via INTRAMUSCULAR
  Filled 2021-11-23: qty 1

## 2021-11-23 MED ORDER — FLUTICASONE PROPIONATE 50 MCG/ACT NA SUSP: 1.0000 | Freq: Every day | NASAL | 2 refills | Status: DC

## 2021-11-23 MED ORDER — LIDOCAINE VISCOUS HCL 2 % MT SOLN: 5.0000 mL | Freq: Four times a day (QID) | OROMUCOSAL | 0 refills | Status: DC | PRN

## 2021-11-23 MED ORDER — ACETAMINOPHEN 325 MG PO TABS: 650.0000 mg | ORAL_TABLET | Freq: Once | ORAL | Status: DC

## 2021-11-23 MED ORDER — LIDOCAINE VISCOUS HCL 2 % MT SOLN
15.0000 mL | Freq: Once | OROMUCOSAL | Status: AC
Start: 2021-11-23 — End: 2021-11-23
  Administered 2021-11-23: 15 mL via OROMUCOSAL
  Filled 2021-11-23: qty 15

## 2021-11-23 MED ORDER — CETIRIZINE HCL 10 MG PO TABS: 10.0000 mg | ORAL_TABLET | Freq: Every day | ORAL | 0 refills | Status: DC

## 2021-11-23 NOTE — Discharge Instructions (Addendum)
Please follow-up with your PCP within the next 3-5 days for reevaluation continue medical management.  If you do not have one, list has been provided for you below.  2 prescriptions have been sent to your pharmacy, they are as follows: Zyrtec-take 1 tablet daily for 1-2 weeks for congestion relief Flonase-Place 2 sprays into both nostrils daily for 10 days for congestion relief Magic Mouthwash-a numbing medication for the throat.  Swish 5 mL around your mouth, then swallow.  You may do this up to 4 times per day.  Keep in mind this mixture has Benadryl in it, and may be accompanied with drowsiness.  Do not drive or operate machinery until you know this affects you and you feel uninhibited using this.  If this causes more drowsiness than you are comfortable with, you may utilize over-the-counter Cepakol lozenges instead.    Continue to utilize Tylenol and ibuprofen for additional aches and pains.  You may also utilize warm tea with honey as well.  Return to the ED for any new or worsening symptoms as discussed.  If you do not have a doctor see the list below.  RESOURCE GUIDE  Chronic Pain Problems: Contact [facility]  [phone] Patients need to be referred by their primary care doctor.  Insufficient Money for Medicine: Contact United Way:  call "211" or [facility] [phone].  No Primary Care Doctor: Call [facility]  [phone] - can help you locate a primary care doctor that  accepts your insurance, provides certain services, etc. Physician Referral Service- [phone] Agencies that provide inexpensive medical care: [facility]  [phone] [facility] Medicine  [phone] [facility]  [phone] [facility]  [phone] Planned [facility]  [phone] [facility]  [phone]  Medicaid-accepting [facility] Providers: [facility]- [address]  [phone], Mon-Fri  9am-7pm, Sat 9am-1pm [facility]- [address] [place], [address] [place]  [phone] [facility]- [address], [address] [place]  [phone] [facility]- [address]  [phone] Renaye Rakers- [address] [place], [address], [phone]  Only accepts [place] patients after they have their name  applied to their card  Self Pay (no insurance) in [facility]: Sickle Cell Patients: Dr Willey Blade, [facility] Internal Medicine  [address] [place], [phone] [facility]- [address] [place]  [phone]       [facility] [place]- [address]       -     [facility]- see information above (Speak to [employer] if you do not have insurance)       -  [facility]- [address] [place], [phone]       -  [facility]- [address],  [phone]       -  [facility]- [address] [phone]       -  Dr Julio Sicks-  [address], [address], [place], [phone]       -  [facility]- [address] [phone]       -  [facility]- [address] [phone], also [address], [phone]       -    [facility]- [address] [place], [phone], 1st & 3rd Saturday  every month, 10am-1pm  1) Find a Doctor and Pay Out of Pocket Although you won't have to find out who is covered by your insurance plan, it is a good idea to ask around and get recommendations. You will then need to call the office and see if the doctor you have chosen will accept you as a new patient and what types of options they offer for patients who are self-pay. Some doctors offer discounts or will set up payment plans for their patients who do not have insurance, but you will need to ask so you aren't surprised when you get to your appointment.  2) Contact Your Local Health Department Not all health departments have doctors that can see patients for  sick visits, but many do, so it is worth a call to see if yours does. If you don't know where your local health department is, you can check in your phone book. The CDC also has a tool to help you locate your state's health department, and many state websites also have listings of all of their local health departments.  3) Find a Walk-in Clinic If your illness is not likely to be very severe or complicated, you may want to try a walk in clinic. These are popping up all over the country in pharmacies, drugstores, and shopping centers. They're usually staffed by nurse practitioners or physician assistants that have been trained to treat common illnesses and complaints. They're usually fairly quick and inexpensive. However, if you have serious medical issues or chronic medical problems, these are probably not your best option  STD Testing [facility] Department of [facility], STD Clinic, [address], [place], phone [phone] or [phone].  Monday - Friday, call for an appointment. [facility] Department of [employer], STD Clinic, [place] [address], [place], phone [phone] or [phone].  Monday - Friday, call for an appointment.  Abuse/Neglect: [facility] Child Abuse Hotline 984-279-7819 [facility] Child Abuse Hotline 669-618-9913 (After Hours)  Emergency Shelter:  [facility] 252-239-0120  Maternity Homes: [facility] [place] [facility] ([phone] [phone] [facility] [phone]  MRSA Hotline #:   585-538-5916  [facility] Resources  [facility] [place]     United Way                          [facility] Dept. [address] [place]                       [address]      [address] [place] [address]  Blondell Reveal Phone:  [phone]                                   Phone:  [phone]                  Phone:  [phone]  [facility], [phone] [facility]- [phone]       -     [facility] in [place], [address],                                  [phone], [facility] Child Abuse Hotline 747-617-0803 or (873)230-5575 (After Hours)  Behavioral Health Services  Substance Abuse Resources: [facility] and Drug Services  (713) 094-0382 [facility] 475-421-7939 [facility] (769)209-8538 Floydene Flock (657) 701-0886 Residential & Outpatient Substance Abuse Program  (848)679-0470  Psychological Services: [facility]  [phone] [facility] Services  [phone] [facility], [phone] [place]. [address] [place], ACCESS LINE: 520-154-2328 or (206) 478-9772, [employer]za  Dental Assistance  Patients with Medicaid: [facility] [id number]                                           [phone] [address] [employer] Phone:  [phone]                                                  Phone:  [phone]  If unable to pay or uninsured, contact:  [facility] or [facility]. to become qualified for the adult dental clinic.  Patients with Medicaid: [facility] [phone] [address], [phone] [address] [phone]  If unable to pay, or uninsured, contact [facility] [phone]) or [facility] Department [phone] in [place], [phone] in [facility]) to become qualified for the adult dental clinic  Other Low-Cost Community Dental Services: [facility]- [address] [place], [place], [place], [address], [phone], [address]. [phone], 2nd and 4th Thursday of the month at 6:30am.  10 clients each day by appointment, can sometimes see walk-in patients if someone does not show for an appointment. [facility]- [address] Ether Griffins [place], [place], [address], [phone] [facility] [address] [place], [place], [address], [phone] [facility] Health Department- [phone] [facility] Health Department- [phone] [facility] [phone]

## 2021-11-23 NOTE — ED Triage Notes (Signed)
Pt reports sore throat x 2 days

## 2021-11-23 NOTE — ED Provider Notes (Signed)
[facility]-EMERGENCY DEPT Provider Note   CSN: [id number] Arrival date & time: 11/23/21  1350     History  Chief Complaint  Patient presents with   Sore Throat    Julia Russell is a 31 y.o. female presenting today with sore throat, congestion, cough, body aches, and overall feeling unwell for the last 2 days.  Sore throat came first, followed quickly by the remaining symptoms.  Denies fever, chills, N/V/D, abdominal pain, neck stiffness.  With some mild painful swallowing, though still able to swallow liquids without difficulty.  Denies drooling, shortness of breath, or chest pain.  States "I looked in the mirror and saw that my tonsils were swollen with white stuff on them".  Denies overwhelming fatigue.  No other complaints at this time.    The history is provided by the patient and medical records.  Sore Throat    Home Medications Prior to Admission medications   Medication Sig Start Date End Date Taking? Authorizing Provider  cetirizine (ZYRTEC ALLERGY) 10 MG tablet Take 1 tablet (10 mg total) by mouth daily for 14 days. 11/23/21 12/07/21 Yes Cecil Cobbs, PA-C  fluticasone (FLONASE) 50 MCG/ACT nasal spray Place 1 spray into both nostrils daily. 11/23/21  Yes Cecil Cobbs, PA-C  magic mouthwash (lidocaine, diphenhydrAMINE, alum & mag hydroxide) suspension Swish and swallow 5 mLs 4 (four) times daily as needed for up to 10 doses for mouth pain. 11/23/21  Yes Cecil Cobbs, PA-C  ibuprofen (ADVIL,MOTRIN) 600 MG tablet Take 1 tablet (600 mg total) by mouth every 6 (six) hours as needed. 01/01/17   Joy, Shawn C, PA-C      Allergies    Latex    Review of Systems   Review of Systems  HENT:  Positive for rhinorrhea, sore throat and trouble swallowing (due to pain).   Respiratory:  Positive for cough.     Physical Exam Updated Vital Signs BP 111/73 (BP Location: Left Arm)   Pulse 71   Temp 98.3 F (36.8 C) (Oral)   Resp 18   SpO2 97%   Physical Exam Vitals and nursing note reviewed.  Constitutional:      General: She is not in acute distress.    Appearance: She is well-developed. She is not ill-appearing, toxic-appearing or diaphoretic.  HENT:     Head: Normocephalic and atraumatic.     Right Ear: Tympanic membrane and ear canal normal.     Left Ear: Tympanic membrane and ear canal normal.     Nose: Congestion and rhinorrhea present.     Right Turbinates: Swollen.     Left Turbinates: Swollen.     Mouth/Throat:     Mouth: Mucous membranes are moist. No oral lesions.     Pharynx: Oropharynx is clear. Posterior oropharyngeal erythema (Mild) present. No pharyngeal swelling, oropharyngeal exudate or uvula swelling.     Tonsils: Tonsillar exudate and tonsillar abscess present. 1+ on the right. 1+ on the left.  Eyes:     Conjunctiva/sclera: Conjunctivae normal.  Neck:     Comments: Very supple on exam, no meningismus or torticollis.  Tender cervical adenopathy bilaterally.  No unilateral palpable mass. Cardiovascular:     Rate and Rhythm: Normal rate and regular rhythm.     Heart sounds: Normal heart sounds. No murmur heard. Pulmonary:     Effort: Pulmonary effort is normal. No respiratory distress.     Breath sounds: Normal breath sounds. No wheezing.  Chest:     Chest  wall: No tenderness.  Abdominal:     Palpations: Abdomen is soft.     Tenderness: There is no abdominal tenderness.  Musculoskeletal:        General: No swelling.     Cervical back: Neck supple. No torticollis.  Lymphadenopathy:     Cervical: Cervical adenopathy present.  Skin:    General: Skin is warm and dry.     Capillary Refill: Capillary refill takes less than 2 seconds.     Findings: No rash.  Neurological:     Mental Status: She is alert and oriented to person, place, and time.  Psychiatric:        Mood and Affect: Mood normal.     ED Results / Procedures / Treatments   Labs (all labs ordered are listed, but only abnormal results  are displayed) Labs Reviewed  RESP PANEL BY RT-PCR (FLU A&B, COVID) ARPGX2  GROUP A STREP BY PCR    EKG None  Radiology No results found.  Procedures Procedures    Medications Ordered in ED Medications  lidocaine (XYLOCAINE) 2 % viscous mouth solution 15 mL (15 mLs Mouth/Throat Given 11/23/21 1607)  dexamethasone (DECADRON) injection 10 mg (10 mg Intramuscular Given 11/23/21 1606)  ketorolac (TORADOL) 15 MG/ML injection 15 mg (15 mg Intramuscular Given 11/23/21 1606)    ED Course/ Medical Decision Making/ A&P                           Medical Decision Making Risk OTC drugs. Prescription drug management.   31 y.o. female presents to the ED for concern of Sore Throat   This involves an extensive number of treatment options, and is a complaint that carries with it a high risk of complications and morbidity.  The emergent differential diagnosis prior to evaluation includes, but is not limited to: strep pharyngitis, viral pharyngitis, PTA, RPA  This is not an exhaustive differential.   Past Medical History / Co-morbidities / Social History: Hx of recreational drug use Social Determinants of Health include: No PCP, resources provided  Additional History:  None  Lab Tests: I ordered, and personally interpreted labs.  The pertinent results include:   Strep: Negative Covid/Flu: Negative  Imaging Studies: None  ED Course: Pt well-appearing on exam.  Presenting today with upper respiratory symptoms, including sore throat, congestion, cough, myalgias over last 2 days.  Afebrile.  Nontoxic, nonseptic appearing in NAD.  Mild odynophagia and bilateral cervical lymphadenopathy.  Mild tonsillar swelling, erythema, and exudate appreciated, without obstruction.  Airway appears patent, ABCs intact.  Does not appear to be drooling, tripoding, or in any respiratory distress.  With mild cough due to throat discomfort.  Without hoarseness, oral ulcers, fever, chills, N/V/D, or meningismus.   Pt does not appear dehydrated, but did discuss importance of water rehydration. Presentation non concerning for meningitis, PTA, or RPA.  No trismus or uvula deviation.   Pt strep, covid, and flu negative.  Clinical diagnosis of viral pharyngitis.  Shared decision making of pregnancy testing prior to NSAID administration.  Pt is with Nexplanon, currently on menses, and undestands risks.  Pt elects to proceed with NSAID use without pregnancy testing, I find this reasonable.   Upon re-evaluation pt has had siginificant improvement following NSAID, decadron, and viscous lidocaine.  Pt able to drink water in ED without difficulty.  Discussed that antibiotics are not indicated for viral infections.  Pt will be discharged with symptomatic treatment.  Plan for close PCP follow  up.  Pt satisfied with today's encounter with in NAD and good condition at time of discharge.   Disposition: After consideration the patient's encounter today, I do not feel today's workup suggests an emergent condition requiring admission or immediate intervention beyond what has been performed at this time.  Safe for discharge; instructed to return immediately for worsening symptoms, change in symptoms or any other concerns.  I have reviewed the patients home medicines and have made adjustments as needed.  Discussed course of treatment with the patient, whom demonstrated understanding.  Patient in agreement and has no further questions.     This chart was dictated using voice recognition software.  Despite best efforts to proofread, errors can occur which can change the documentation meaning.         Final Clinical Impression(s) / ED Diagnoses Final diagnoses:  Acute pharyngitis, unspecified etiology  Tonsillitis    Rx / DC Orders ED Discharge Orders          Ordered    cetirizine (ZYRTEC ALLERGY) 10 MG tablet  Daily        11/23/21 1626    fluticasone (FLONASE) 50 MCG/ACT nasal spray  Daily        11/23/21 1626     magic mouthwash (lidocaine, diphenhydrAMINE, alum & mag hydroxide) suspension  4 times daily PRN       Note to Pharmacy: 1:1:1 ratio please   11/23/21 1626              Cecil Cobbs, Cordelia Poche 11/25/21 [id number]    Jacalyn Lefevre, MD 11/27/21 [id number]

## 2022-05-22 ENCOUNTER — Encounter (HOSPITAL_COMMUNITY): Payer: Self-pay

## 2022-05-22 ENCOUNTER — Other Ambulatory Visit: Payer: Self-pay

## 2022-05-22 ENCOUNTER — Emergency Department (HOSPITAL_COMMUNITY)
Admission: EM | Admit: 2022-05-22 | Discharge: 2022-05-22 | Disposition: A | Discharge status: Home / Self Care | Payer: Medicaid Other | Attending: Emergency Medicine | Admitting: Emergency Medicine

## 2022-05-22 DIAGNOSIS — Z91040 Latex allergy status: Secondary | ICD-10-CM | POA: Diagnosis not present

## 2022-05-22 DIAGNOSIS — J020 Streptococcal pharyngitis: Secondary | ICD-10-CM | POA: Diagnosis not present

## 2022-05-22 DIAGNOSIS — J029 Acute pharyngitis, unspecified: Secondary | ICD-10-CM | POA: Diagnosis present

## 2022-05-22 DIAGNOSIS — Z1152 Encounter for screening for COVID-19: Secondary | ICD-10-CM | POA: Insufficient documentation

## 2022-05-22 LAB — RESP PANEL BY RT-PCR (RSV, FLU A&B, COVID)  RVPGX2
Influenza A by PCR: NEGATIVE
Influenza B by PCR: NEGATIVE
Resp Syncytial Virus by PCR: NEGATIVE
SARS Coronavirus 2 by RT PCR: NEGATIVE

## 2022-05-22 LAB — GROUP A STREP BY PCR: Group A Strep by PCR: DETECTED — AB

## 2022-05-22 MED ORDER — PENICILLIN V POTASSIUM 500 MG PO TABS: 500.0000 mg | ORAL_TABLET | Freq: Two times a day (BID) | ORAL | 0 refills | Status: AC

## 2022-05-22 MED ORDER — DEXAMETHASONE SODIUM PHOSPHATE 10 MG/ML IJ SOLN
10.0000 mg | Freq: Once | INTRAMUSCULAR | Status: AC
  Administered 2022-05-22: 10 mg via INTRAVENOUS
  Filled 2022-05-22: qty 1

## 2022-05-22 MED ORDER — KETOROLAC TROMETHAMINE 15 MG/ML IJ SOLN
15.0000 mg | Freq: Once | INTRAMUSCULAR | Status: AC
  Administered 2022-05-22: 15 mg via INTRAVENOUS
  Filled 2022-05-22: qty 1

## 2022-05-22 MED ORDER — BACITRACIN ZINC 500 UNIT/GM EX OINT
TOPICAL_OINTMENT | Freq: Once | CUTANEOUS | Status: AC
Start: 2022-05-22 — End: 2022-05-22
  Administered 2022-05-22: 1 via TOPICAL
  Filled 2022-05-22: qty 0.9

## 2022-05-22 MED ORDER — PENICILLIN V POTASSIUM 500 MG PO TABS
500.0000 mg | ORAL_TABLET | Freq: Once | ORAL | Status: AC
  Administered 2022-05-22: 500 mg via ORAL
  Filled 2022-05-22: qty 1

## 2022-05-22 MED ORDER — LIDOCAINE VISCOUS HCL 2 % MT SOLN
15.0000 mL | Freq: Once | OROMUCOSAL | Status: AC
  Administered 2022-05-22: 15 mL via OROMUCOSAL
  Filled 2022-05-22: qty 15

## 2022-05-22 NOTE — Discharge Instructions (Signed)
It was a pleasure taking care of you today!  Your strep test was positive in the ED. Your COVID, flu, RSV swab was negative today.  You were given your first dose of antibiotic (penicillin) in the emergency department today.  Will send prescription for penicillin, take as directed.  You may take over-the-counter 600 mg ibuprofen every 6 hours and alternate with 500 mg Tylenol every 6 hours as needed for pain for no more than 7 days.  Ensure to maintain fluid intake with tea, broth, soup, Gatorade, Pedialyte, water.  You may follow-up with your primary care provider as needed.  Return to the emergency department if experiencing increasing/worsening trouble swallowing, trouble breathing, fever, worsening symptoms.

## 2022-05-22 NOTE — ED Triage Notes (Signed)
C/o difficulty swallowing with n/v, sore throat, chills x2 days.  Pt reports being seen for same on 11/23/21.

## 2022-05-22 NOTE — ED Provider Notes (Signed)
[facility] Provider Note   CSN: [id number] Arrival date & time: 05/22/22  1654     History  Chief Complaint  Patient presents with   Sore Throat    Julia Russell is a 32 y.o. female who presents to the ED with concerns for sore throat x 2 days. No sick contacts. Feels similar to when she was seen in August.  Patient has tried ginger ale and Alka-Seltzer at home for his symptoms.  Has associated chills, painful swallowing, nausea, vomiting. Denies fever, rhinorrhea, nasal congestion, trouble swallowing, cough, shortness of breath, abdominal pain.  Also notes bump to her right lateral thigh that she scratched and noted pus to the area yesterday. No redness noted.    The history is provided by the patient. No language interpreter was used.       Home Medications Prior to Admission medications   Medication Sig Start Date End Date Taking? Authorizing Provider  penicillin v potassium (VEETID) 500 MG tablet Take 1 tablet (500 mg total) by mouth 2 (two) times daily for 10 days. 05/22/22 06/01/22 Yes Suriyah Vergara A, PA-C  cetirizine (ZYRTEC ALLERGY) 10 MG tablet Take 1 tablet (10 mg total) by mouth daily for 14 days. 11/23/21 [id number]  Prince Rome, PA-C  fluticasone (FLONASE) 50 MCG/ACT nasal spray Place 1 spray into both nostrils daily. [id number]   Prince Rome, PA-C  ibuprofen (ADVIL,MOTRIN) 600 MG tablet Take 1 tablet (600 mg total) by mouth every 6 (six) hours as needed. 01/01/17   Joy, Shawn C, PA-C  magic mouthwash (lidocaine, diphenhydrAMINE, alum & mag hydroxide) suspension Swish and swallow 5 mLs 4 (four) times daily as needed for up to 10 doses for mouth pain. [id number]   Prince Rome, PA-C      Allergies    Latex    Review of Systems   Review of Systems  Constitutional:  Positive for chills. Negative for fever.  HENT:  Positive for sore throat. Negative for congestion, rhinorrhea and trouble swallowing.         +painful swallowing  Respiratory:  Negative for cough and shortness of breath.   Gastrointestinal:  Positive for nausea and vomiting. Negative for abdominal pain.  All other systems reviewed and are negative.   Physical Exam Updated Vital Signs BP 138/83 (BP Location: Right Arm)   Pulse (!) 114   Temp 99.6 F (37.6 C) (Oral)   Resp 16   Wt 74 kg   SpO2 99%   BMI 28.00 kg/m  Physical Exam Vitals and nursing note reviewed.  Constitutional:      General: She is not in acute distress.    Appearance: Normal appearance.  HENT:     Mouth/Throat:     Mouth: Mucous membranes are moist.     Pharynx: Oropharynx is clear. Uvula midline. Posterior oropharyngeal erythema present. No uvula swelling.     Tonsils: Tonsillar exudate present. No tonsillar abscesses.     Comments: Uvula midline without swelling. Posterior pharyngeal erythema and tonsillar exudate noted. Patent airway. No palpable tonsillar fluctuance noted. Pt able to speak in clear complete sentences. Tolerating oral secretions. Eyes:     General: No scleral icterus.    Extraocular Movements: Extraocular movements intact.  Cardiovascular:     Rate and Rhythm: Normal rate.  Pulmonary:     Effort: Pulmonary effort is normal. No respiratory distress.  Abdominal:     Palpations: Abdomen is soft. There is no mass.  Tenderness: There is no abdominal tenderness.  Musculoskeletal:        General: Normal range of motion.     Cervical back: Neck supple.  Skin:    General: Skin is warm and dry.     Findings: No rash.     Comments: No appreciable fluctuance or surrounding erythema noted to right lateral thigh. No induration noted. No TTP noted to the area.   Neurological:     Mental Status: She is alert.     Sensory: Sensation is intact.     Motor: Motor function is intact.  Psychiatric:        Behavior: Behavior normal.     ED Results / Procedures / Treatments   Labs (all labs ordered are listed, but only abnormal  results are displayed) Labs Reviewed  GROUP A STREP BY PCR - Abnormal; Notable for the following components:      Result Value   Group A Strep by PCR DETECTED (*)    All other components within normal limits  RESP PANEL BY RT-PCR (RSV, FLU A&B, COVID)  RVPGX2    EKG None  Radiology No results found.  Procedures Procedures    Medications Ordered in ED Medications  ketorolac (TORADOL) 15 MG/ML injection 15 mg (15 mg Intravenous Given 05/22/22 1801)  dexamethasone (DECADRON) injection 10 mg (10 mg Intravenous Given 05/22/22 1801)  lidocaine (XYLOCAINE) 2 % viscous mouth solution 15 mL (15 mLs Mouth/Throat Given 05/22/22 1806)  penicillin v potassium (VEETID) tablet 500 mg (500 mg Oral Given 05/22/22 1929)  bacitracin ointment (1 Application Topical Given 05/22/22 1930)    ED Course/ Medical Decision Making/ A&P Clinical Course as of 05/22/22 1935  Tue May 22, 2022  1841 Group A Strep by PCR(!): DETECTED [SB]  1841 Pt re-evaluated and noted improvement of her symptoms with treatment regimen in the ED. Pt opted for PO treatment.  [SB]  1917 Re-evaluated and discussed with patient's lab findings.  Heart rate improved to 88 in the room.  Patient continues to talk without difficulty.  Discussed with patient discharge treatment plan.  Answered all available questions.  Patient present for discharge at this time. [SB]    Clinical Course User Index [SB] Lenise Jr A, PA-C                             Medical Decision Making Amount and/or Complexity of Data Reviewed Labs:  Decision-making details documented in ED Course.  Risk OTC drugs. Prescription drug management.   Patient with sore throat onset 2 days.  Denies sick contacts.  On exam patient with Uvula midline without swelling. Posterior pharyngeal erythema and tonsillar exudate noted. Patent airway. No palpable tonsillar fluctuance noted. Pt able to speak in clear complete sentences. Tolerating oral secretions. No appreciable  fluctuance or surrounding erythema noted to right lateral thigh. No induration noted. No TTP noted to the area.  No acute cardiovascular, respiratory, abdominal exam findings.  Differential diagnosis includes strep pharyngitis, peritonsillar abscess, strep pharyngitis, or Ludwig's angina, COVID, Flu, RSV, cellulitis, abscess.   Labs:  I ordered, and personally interpreted labs.  The pertinent results include:   COVID, RSV, and flu swab negative Strep swab positive.  Medications:  I ordered medication including penicillin, decadron, viscous lidocaine, toradol for strep treatment and symptom management Reevaluation of the patient after these medicines and interventions, I reevaluated the patient and found that they have improved I have reviewed the patients home medicines  and have made adjustments as needed   Disposition: Pt presentation suspicious for strep pharyngitis. Doubt COVID, RSV, or Flu at this time. Patent airway, tolerating secretions, no concern for airway compromise. Less likely Ludwigs angina, no trismus or edema to floor of mouth on exam. Less likely peritonsillar abscess, no fluctuant abscess noted on exam, patent airway, oxygen saturation 100%, water and tolerating secretions. Tachycardia improved to 88 prior to discharge. Doubt concerns for COVID, Flu, RSV at this time. Doubt cellulitis or abscess at this time.  Offered patient one-time penicillin dose IM in the ED, to which patient declined and opted for prescription for penicillin and over-the-counter ibuprofen or Tylenol as needed for pain. Pt will be discharged with penicillin prescription.  After consideration of the diagnostic results and the patients response to treatment, I feel that the patient would benefit from Discharge home. Strict return precautions discussed including inability to tolerate secretions, fever, inability to open mouth.  Patient acknowledges and verbalizes understanding.  Recommended primary care follow-up.   Patient appears safe for discharge at this time.  Follow-up as indicated in discharge paperwork.  This chart was dictated using voice recognition software, Dragon. Despite the best efforts of this provider to proofread and correct errors, errors may still occur which can change documentation meaning.  Final Clinical Impression(s) / ED Diagnoses Final diagnoses:  Strep pharyngitis    Rx / DC Orders ED Discharge Orders          Ordered    penicillin v potassium (VEETID) 500 MG tablet  2 times daily        05/22/22 1924              Preethi Scantlebury A, PA-C 05/22/22 1938    Wyvonnia Dusky, MD 05/22/22 2055

## 2022-09-01 ENCOUNTER — Encounter (HOSPITAL_COMMUNITY): Payer: Self-pay

## 2022-09-01 ENCOUNTER — Emergency Department (HOSPITAL_COMMUNITY)
Admission: EM | Admit: 2022-09-01 | Discharge: 2022-09-01 | Disposition: A | Discharge status: Home / Self Care | Payer: Medicaid Other | Attending: Emergency Medicine | Admitting: Emergency Medicine

## 2022-09-01 ENCOUNTER — Other Ambulatory Visit: Payer: Self-pay

## 2022-09-01 DIAGNOSIS — Z23 Encounter for immunization: Secondary | ICD-10-CM | POA: Insufficient documentation

## 2022-09-01 DIAGNOSIS — L0291 Cutaneous abscess, unspecified: Secondary | ICD-10-CM

## 2022-09-01 DIAGNOSIS — L0201 Cutaneous abscess of face: Secondary | ICD-10-CM | POA: Diagnosis present

## 2022-09-01 MED ORDER — DOXYCYCLINE HYCLATE 100 MG PO TABS
100.0000 mg | ORAL_TABLET | Freq: Once | ORAL | Status: AC
  Administered 2022-09-01: 100 mg via ORAL
  Filled 2022-09-01: qty 1

## 2022-09-01 MED ORDER — LIDOCAINE HCL (PF) 1 % IJ SOLN
5.0000 mL | Freq: Once | INTRAMUSCULAR | Status: AC
  Administered 2022-09-01: 5 mL
  Filled 2022-09-01: qty 30

## 2022-09-01 MED ORDER — TETANUS-DIPHTH-ACELL PERTUSSIS 5-2.5-18.5 LF-MCG/0.5 IM SUSY: 0.5000 mL | PREFILLED_SYRINGE | Freq: Once | INTRAMUSCULAR | Status: DC

## 2022-09-01 MED ORDER — DOXYCYCLINE HYCLATE 100 MG PO CAPS: 100.0000 mg | ORAL_CAPSULE | Freq: Two times a day (BID) | ORAL | 0 refills | Status: DC

## 2022-09-01 NOTE — ED Provider Notes (Signed)
[place] EMERGENCY DEPARTMENT AT [facility] Provider Note   CSN: [id number] Arrival date & time: 09/01/22  2102     History  Chief Complaint  Patient presents with   Abscess   HPI Julia Russell is a 32 y.o. female with history of multiple abscesses presenting for abscess on the forehead. States she noticed a bump in the middle of her forehead about a week ago.  It is now painful and "swelling".  Not oozing or bleeding.  States it "feels like an abscess".  Last tetanus shot was last year per patient.   Abscess      Home Medications Prior to Admission medications   Medication Sig Start Date End Date Taking? Authorizing Provider  doxycycline (VIBRAMYCIN) 100 MG capsule Take 1 capsule (100 mg total) by mouth 2 (two) times daily. 09/01/22  Yes Gareth Eagle, PA-C  cetirizine (ZYRTEC ALLERGY) 10 MG tablet Take 1 tablet (10 mg total) by mouth daily for 14 days. 11/23/21 12/07/21  Cecil Cobbs, PA-C  fluticasone (FLONASE) 50 MCG/ACT nasal spray Place 1 spray into both nostrils daily. 11/23/21   Cecil Cobbs, PA-C  ibuprofen (ADVIL,MOTRIN) 600 MG tablet Take 1 tablet (600 mg total) by mouth every 6 (six) hours as needed. 01/01/17   Joy, Shawn C, PA-C  magic mouthwash (lidocaine, diphenhydrAMINE, alum & mag hydroxide) suspension Swish and swallow 5 mLs 4 (four) times daily as needed for up to 10 doses for mouth pain. 11/23/21   Cecil Cobbs, PA-C      Allergies    Latex    Review of Systems   See HPI for pertinent positives  Physical Exam Updated Vital Signs BP 130/89   Pulse 100   Temp 98.4 F (36.9 C) (Oral)   Resp 20   Ht 5\' 7"  (1.702 m)   Wt 77.6 kg   SpO2 100%   BMI 26.78 kg/m  Physical Exam Constitutional:      Appearance: Normal appearance.  HENT:     Head: Normocephalic.      Nose: Nose normal.  Eyes:     Conjunctiva/sclera: Conjunctivae normal.  Pulmonary:     Effort: Pulmonary effort is normal.  Neurological:     Mental  Status: She is alert.  Psychiatric:        Mood and Affect: Mood normal.     ED Results / Procedures / Treatments   Labs (all labs ordered are listed, but only abnormal results are displayed) Labs Reviewed - No data to display  EKG None  Radiology No results found.  Procedures .Marland KitchenIncision and Drainage  Date/Time: 09/01/2022 11:34 PM  Performed by: Gareth Eagle, PA-C Authorized by: Gareth Eagle, PA-C   Consent:    Consent obtained:  Verbal   Consent given by:  Patient   Risks discussed:  Bleeding   Alternatives discussed:  No treatment Universal protocol:    Procedure explained and questions answered to patient or proxy's satisfaction: yes     Relevant documents present and verified: yes     Patient identity confirmed:  Verbally with patient and arm band Location:    Type:  Abscess   Size:  1.5   Location:  Head   Head location:  Face (forehead) Pre-procedure details:    Skin preparation:  Povidone-iodine Sedation:    Sedation type:  None Anesthesia:    Anesthesia method:  Local infiltration   Local anesthetic:  Lidocaine 1% w/o epi Procedure type:    Complexity:  Simple Procedure details:    Ultrasound guidance: no     Needle aspiration: yes     Needle size:  18 G   Incision types:  Single straight   Incision depth:  Dermal   Drainage:  Purulent and bloody   Drainage amount:  Moderate   Wound treatment:  Wound left open   Packing materials:  None Post-procedure details:    Procedure completion:  Tolerated well, no immediate complications     Medications Ordered in ED Medications  Tdap (BOOSTRIX) injection 0.5 mL (0.5 mLs Intramuscular Patient Refused/Not Given 09/01/22 2330)  doxycycline (VIBRA-TABS) tablet 100 mg (has no administration in time range)  lidocaine (PF) (XYLOCAINE) 1 % injection 5 mL (5 mLs Infiltration Given by Other 09/01/22 2325)    ED Course/ Medical Decision Making/ A&P                             Medical Decision  Making Risk Prescription drug management.   32 yo well-appearing female presenting for concern for abscess on her forehead.  Exam was notable for a likely abscess in the center of her forehead.  I&D procedure was well-tolerated.  See procedure note above.  Started patient on doxycycline for empiric antibiotic treatment.  Discussed return precautions.  Advised to follow-up with her PCP.  Vital stable discharge.  Discharged home.        Final Clinical Impression(s) / ED Diagnoses Final diagnoses:  Abscess    Rx / DC Orders ED Discharge Orders          Ordered    doxycycline (VIBRAMYCIN) 100 MG capsule  2 times daily        09/01/22 2338              Gareth Eagle, PA-C 09/01/22 2338    Tilden Fossa, MD 09/02/22 [id number]

## 2022-09-01 NOTE — ED Triage Notes (Signed)
Pt. Arrives POV c/o an abscess to the forehead. Pt. States that she has been trying to push on it to drain it but it has been bothering her for the past 2 weeks. Pt. States that it is a lot smaller now but it still hurts bad.

## 2022-09-01 NOTE — Discharge Instructions (Addendum)
Evaluation today revealed that you likely did have an abscess in your forehead.  Incision and drainage procedure went well.  I was able to express a significant amount of pustulant fluid from the abscess.  For this reason, I am starting you on doxycycline which is an antibiotic to treat for likely associated infection.  Recommend you take the entire course and follow-up with PCP.  If you have new swelling, redness, fever or chills or any other concerning symptom please return emergency department further evaluation.

## 2023-03-09 ENCOUNTER — Other Ambulatory Visit: Payer: Self-pay

## 2023-03-09 ENCOUNTER — Emergency Department (HOSPITAL_COMMUNITY): Payer: Medicaid Other

## 2023-03-09 ENCOUNTER — Emergency Department (HOSPITAL_COMMUNITY)
Admission: EM | Admit: 2023-03-09 | Discharge: 2023-03-09 | Disposition: A | Discharge status: Home / Self Care | Payer: Medicaid Other | Attending: Emergency Medicine | Admitting: Emergency Medicine

## 2023-03-09 DIAGNOSIS — Z91040 Latex allergy status: Secondary | ICD-10-CM | POA: Diagnosis not present

## 2023-03-09 DIAGNOSIS — M79672 Pain in left foot: Secondary | ICD-10-CM | POA: Insufficient documentation

## 2023-03-09 DIAGNOSIS — M25572 Pain in left ankle and joints of left foot: Secondary | ICD-10-CM | POA: Diagnosis not present

## 2023-03-09 MED ORDER — MELOXICAM 15 MG PO TABS: 15.0000 mg | ORAL_TABLET | Freq: Every day | ORAL | 0 refills | Status: AC

## 2023-03-09 MED ORDER — KETOROLAC TROMETHAMINE 15 MG/ML IJ SOLN
15.0000 mg | Freq: Once | INTRAMUSCULAR | Status: AC
Start: 2023-03-09 — End: 2023-03-09
  Administered 2023-03-09: 15 mg via INTRAMUSCULAR
  Filled 2023-03-09: qty 1
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
## 2023-03-09 NOTE — Discharge Instructions (Signed)
Your x-rays show no fracture or dislocation. Please take the prescribed medication daily. You may also take Tylenol/acetaminophen. Use the walking boot/crutches to reduce weight bearing and support the foot/ankle. Please follow up with orthopedic surgery for further evaluation
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
## 2024-01-02 IMAGING — CT CT HEAD W/O CM
4 series · 17 of 47 positions shown, 19 images · non-contrast
Comparison: None Available.

CLINICAL DATA: Headache, new or worsening, neuro deficit (Age
18-49y)



[Series 2: head wo · axial · 0.47mm/px · z∈[-229,-124]mm · 7 of 29 slices shown, 9 images]
[im 4/29  brain]
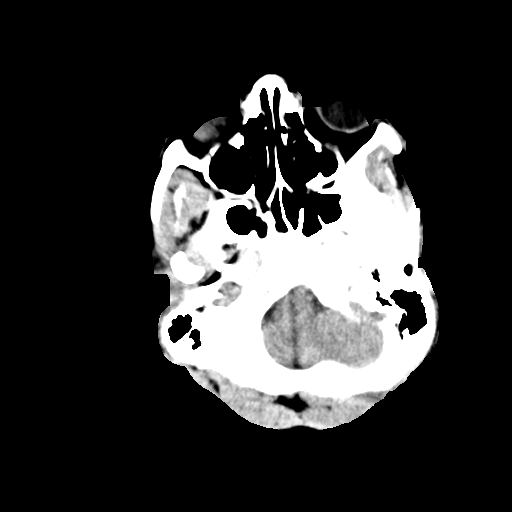
[im 4/29  bone]
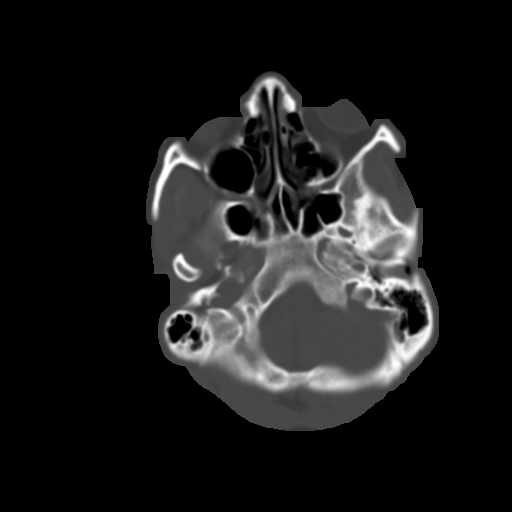
[im 8/29  brain]
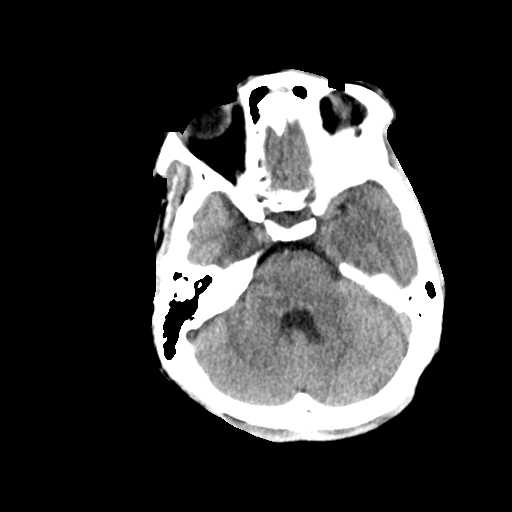
[im 11/29  brain]
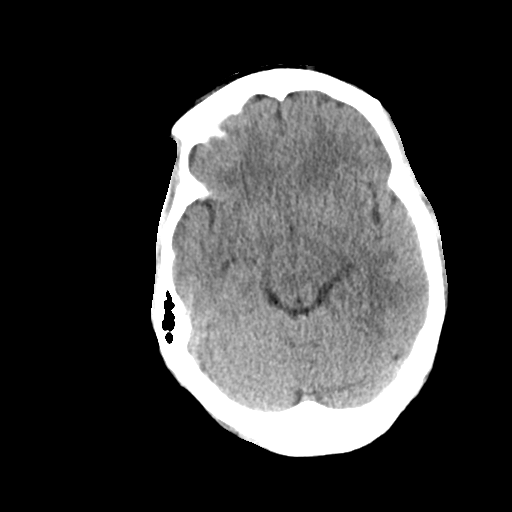
[im 15/29  brain]
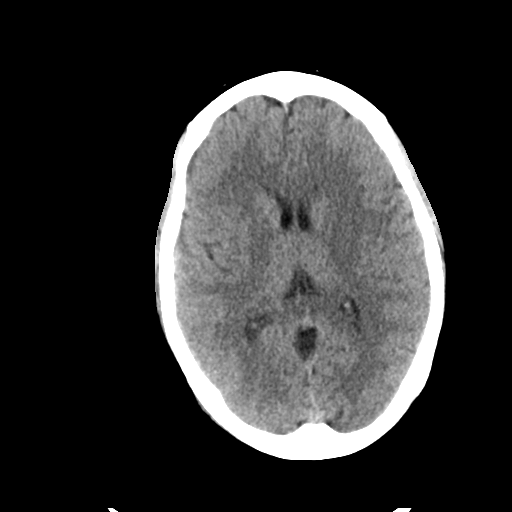
[im 18/29  brain]
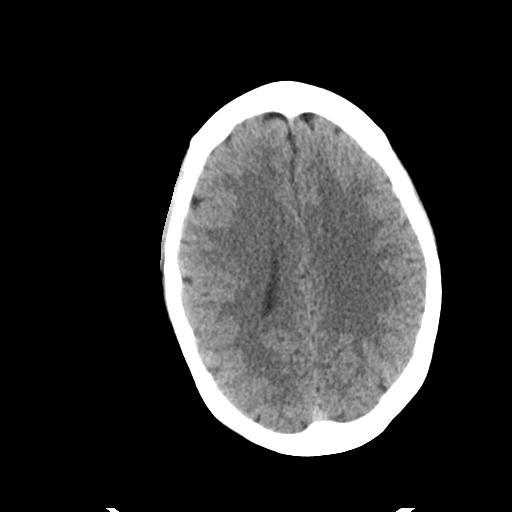
[im 18/29  bone]
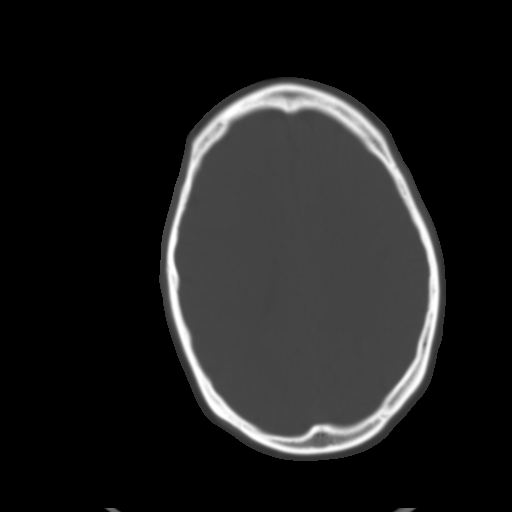
[im 22/29  brain]
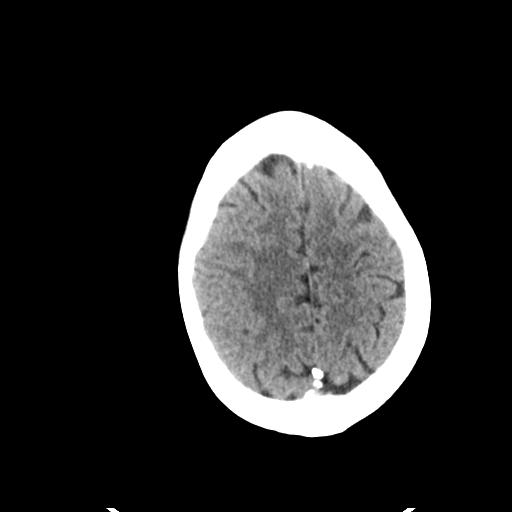
[im 25/29  brain]
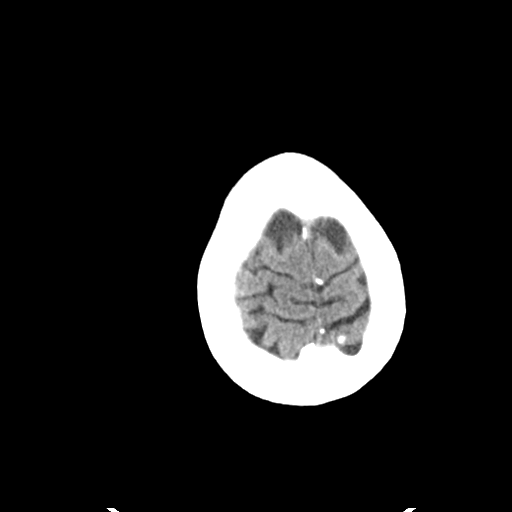

[Series 3: head bone · axial · 0.47mm/px · z∈[-230,-182]mm · 4 of 72 slices shown]
[im 8/72  bone]
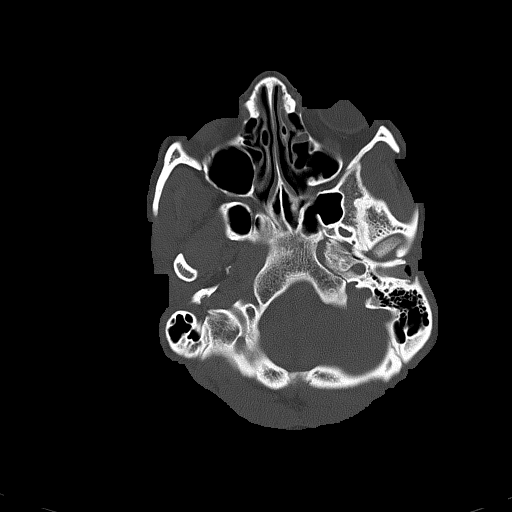
[im 15/72  bone]
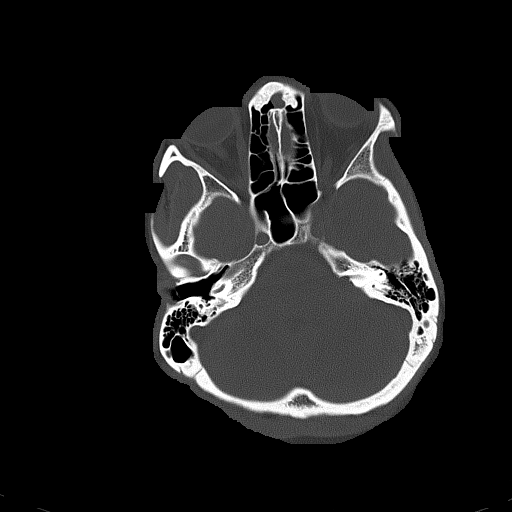
[im 22/72  bone]
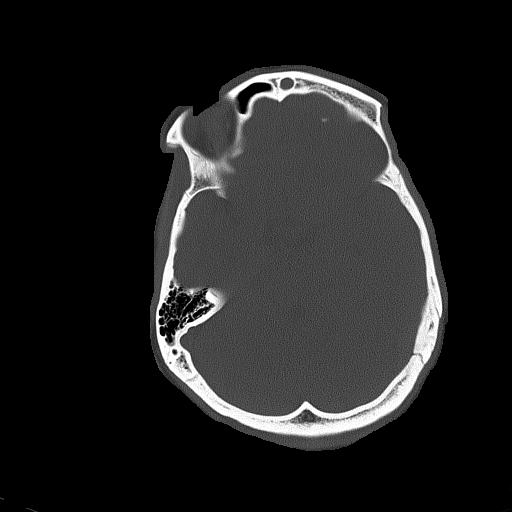
[im 32/72  bone]
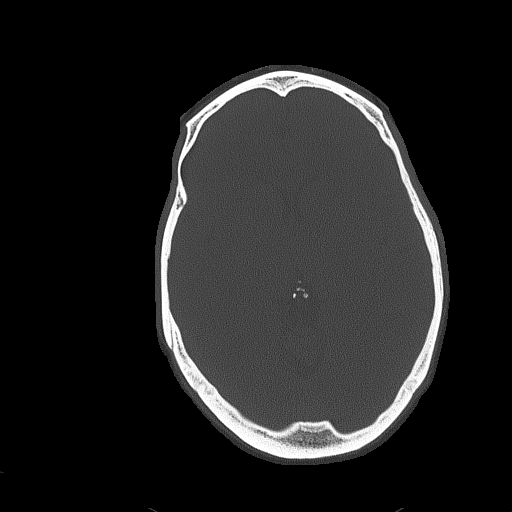

[Series 4: coronal soft tissue · coronal · 0.35mm/px · 3 of 65 slices shown]
[im 22/65  brain]
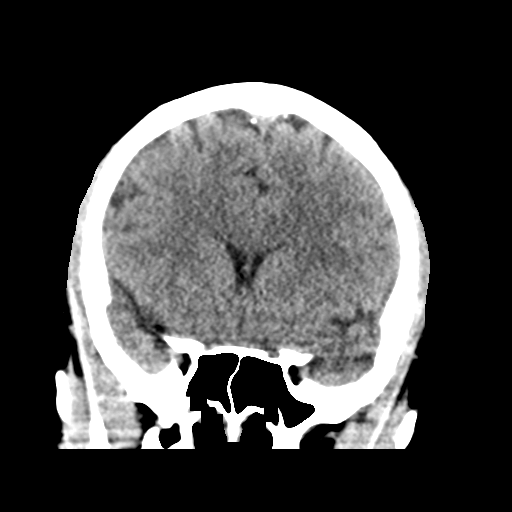
[im 29/65  brain]
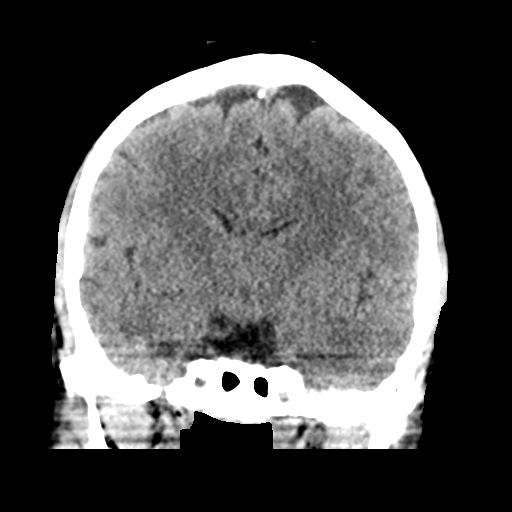
[im 36/65  brain]
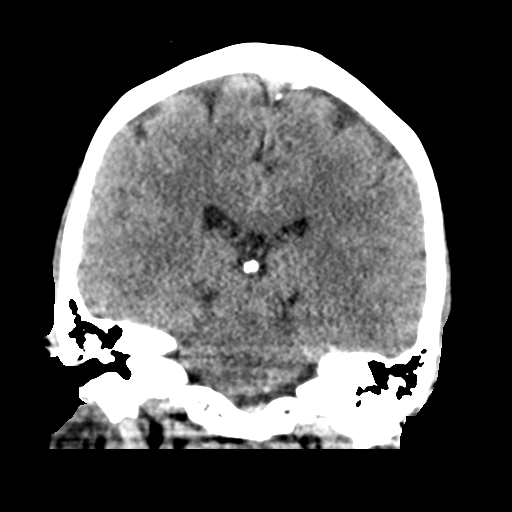

[Series 5: sagittal soft tissue · sagittal · 0.36mm/px · 3 of 48 slices shown]
[im 16/48  brain]
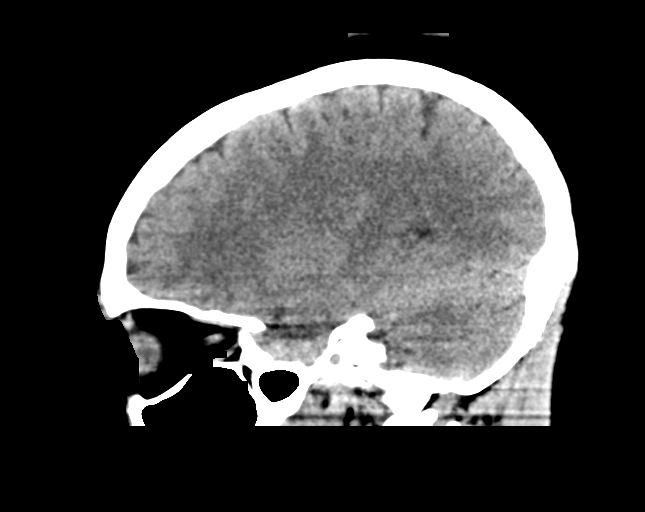
[im 24/48  brain]
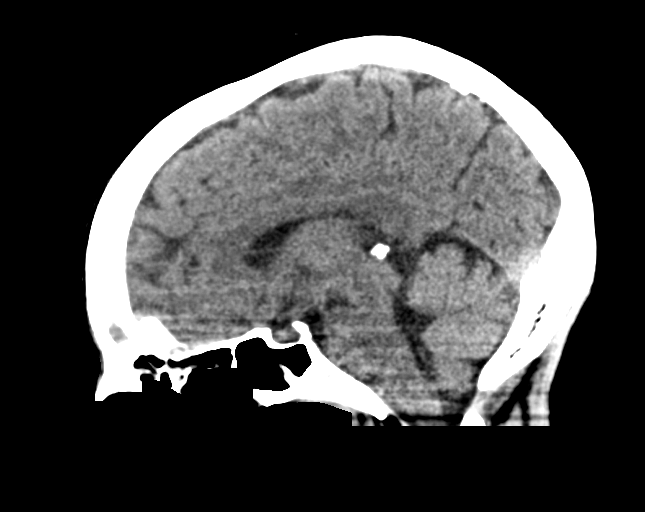
[im 32/48  brain]
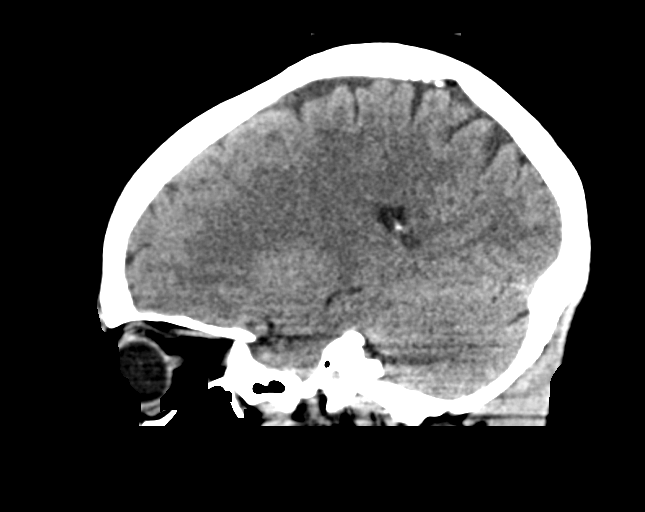

[17 of 47 positions shown; findings below may reference images not displayed]

FINDINGS: Brain: No evidence of acute intracranial hemorrhage or extra-axial
collection.No evidence of mass lesion/concerning mass effect.The
ventricles are normal in size.

Vascular: No hyperdense vessel or unexpected calcification.

Skull: Normal. Negative for fracture or focal lesion.

Sinuses/Orbits: Mucous retention cyst in the right maxillary sinus
and frontal sinus.

Other: None.
IMPRESSION: No acute intracranial abnormality.

## 2024-01-30 ENCOUNTER — Ambulatory Visit: Admitting: Obstetrics

## 2024-01-30 ENCOUNTER — Other Ambulatory Visit (HOSPITAL_COMMUNITY)
Admission: RE | Admit: 2024-01-30 | Discharge: 2024-01-30 | Disposition: A | Source: Ambulatory Visit | Attending: Obstetrics | Admitting: Obstetrics

## 2024-01-30 ENCOUNTER — Encounter: Payer: Self-pay | Admitting: Obstetrics

## 2024-01-30 VITALS — BP 117/72 | HR 71 | Ht 67.0 in | Wt 153.3 lb

## 2024-01-30 DIAGNOSIS — Z975 Presence of (intrauterine) contraceptive device: Secondary | ICD-10-CM

## 2024-01-30 DIAGNOSIS — N898 Other specified noninflammatory disorders of vagina: Secondary | ICD-10-CM | POA: Diagnosis not present

## 2024-01-30 DIAGNOSIS — Z01419 Encounter for gynecological examination (general) (routine) without abnormal findings: Secondary | ICD-10-CM | POA: Insufficient documentation

## 2024-01-30 DIAGNOSIS — N926 Irregular menstruation, unspecified: Secondary | ICD-10-CM

## 2024-01-30 DIAGNOSIS — Z3202 Encounter for pregnancy test, result negative: Secondary | ICD-10-CM

## 2024-01-30 LAB — POCT URINE PREGNANCY: Preg Test, Ur: NEGATIVE

## 2024-01-30 NOTE — Progress Notes (Addendum)
 Subjective:        Julia Russell is a 33 y.o. female here for a routine exam.  Current complaints: Missed period.    Personal health questionnaire:  Is patient Ashkenazi Jewish, have a family history of breast and/or ovarian cancer: yes Is there a family history of uterine cancer diagnosed at age < 15, gastrointestinal cancer, urinary tract cancer, family member who is a Personnel Officer syndrome-associated carrier: no Is the patient overweight and hypertensive, family history of diabetes, personal history of gestational diabetes, preeclampsia or PCOS: no Is patient over 37, have PCOS,  family history of premature CHD under age 45, diabetes, smoke, have hypertension or peripheral artery disease:  no At any time, has a partner hit, kicked or otherwise hurt or frightened you?: no Over the past 2 weeks, have you felt down, depressed or hopeless?: no Over the past 2 weeks, have you felt little interest or pleasure in doing things?:no   Gynecologic History Patient's last menstrual period was 11/30/2023 (exact date). Contraception: Nexplanon  ( expired ) Last Pap: 2017. Results were: normal Last mammogram: n/a. Results were: n/a  Obstetric History OB History  Gravida Para Term Preterm AB Living  5 4 2 2  4   SAB IAB Ectopic Multiple Live Births      4    # Outcome Date GA Lbr Len/2nd Weight Sex Type Anes PTL Lv  5 Gravida           4 Preterm 09/04/09 [redacted]w[redacted]d  4 lb (1.814 kg) F Vag-Spont None Y LIV  3 Preterm 10/08/07 [redacted]w[redacted]d  4 lb 2 oz (1.871 kg) F Vag-Spont None Y LIV  2 Term 06/25/06 [redacted]w[redacted]d  6 lb 9 oz (2.977 kg) M Vag-Spont None Y LIV  1 Term 07/22/03 [redacted]w[redacted]d  6 lb 9 oz (2.977 kg) M Vag-Spont EPI N LIV    Past Medical History:  Diagnosis Date   Abscess     History reviewed. No pertinent surgical history.   Current Outpatient Medications:    cetirizine  (ZYRTEC  ALLERGY) 10 MG tablet, Take 1 tablet (10 mg total) by mouth daily for 14 days. (Patient not taking: Reported on 01/30/2024), Disp:  14 tablet, Rfl: 0   doxycycline  (VIBRAMYCIN ) 100 MG capsule, Take 1 capsule (100 mg total) by mouth 2 (two) times daily. (Patient not taking: Reported on 01/30/2024), Disp: 20 capsule, Rfl: 0   fluticasone  (FLONASE ) 50 MCG/ACT nasal spray, Place 1 spray into both nostrils daily. (Patient not taking: Reported on 01/30/2024), Disp: 11.1 mL, Rfl: 2   ibuprofen  (ADVIL ,MOTRIN ) 600 MG tablet, Take 1 tablet (600 mg total) by mouth every 6 (six) hours as needed. (Patient not taking: Reported on 01/30/2024), Disp: 30 tablet, Rfl: 0   magic mouthwash (lidocaine , diphenhydrAMINE , alum & mag hydroxide) suspension, Swish and swallow 5 mLs 4 (four) times daily as needed for up to 10 doses for mouth pain. (Patient not taking: Reported on 01/30/2024), Disp: 50 mL, Rfl: 0  Current Facility-Administered Medications:    etonogestrel  (NEXPLANON ) implant 68 mg, 68 mg, Subdermal, Once, Rudy Carlin LABOR, MD Allergies  Allergen Reactions   Latex Itching and Rash    Social History   Tobacco Use   Smoking status: Former    Current packs/day: 0.00    Types: Cigarettes    Quit date: 08/13/2015    Years since quitting: 8.4   Smokeless tobacco: Never  Substance Use Topics   Alcohol use: Yes    Family History  Problem Relation Age of Onset  Hypertension Mother    Diabetes Mother    Cancer Mother    Cancer Maternal Grandmother    Diabetes Maternal Grandmother       Review of Systems  Constitutional: negative for fatigue and weight loss Respiratory: negative for cough and wheezing Cardiovascular: negative for chest pain, fatigue and palpitations Gastrointestinal: negative for abdominal pain and change in bowel habits Musculoskeletal:negative for myalgias Neurological: negative for gait problems and tremors Behavioral/Psych: negative for abusive relationship, depression Endocrine: negative for temperature intolerance    Genitourinary: positive for vaginal discharge and missed period.  negative for genital  lesions, hot flashes, sexual problems  Integument/breast: negative for breast lump, breast tenderness, nipple discharge and skin lesion(s)    Objective:       BP 117/72   Pulse 71   Ht 5' 7 (1.702 m)   Wt 153 lb 4.8 oz (69.5 kg)   LMP 11/30/2023 (Exact Date)   BMI 24.01 kg/m  General:   Alert and no distress  Skin:   no rash or abnormalities  Lungs:   clear to auscultation bilaterally  Heart:   regular rate and rhythm, S1, S2 normal, no murmur, click, rub or gallop  Breasts:   normal without suspicious masses, skin or nipple changes or axillary nodes  Abdomen:  normal findings: no organomegaly, soft, non-tender and no hernia  Pelvis:  External genitalia: normal general appearance Urinary system: urethral meatus normal and bladder without fullness, nontender Vaginal: normal without tenderness, induration or masses Cervix: normal appearance Adnexa: normal bimanual exam Uterus: anteverted and non-tender, normal size   Lab Review Urine pregnancy test Labs reviewed yes Radiologic studies reviewed no  I have spent a total of 20 minutes of face-to-face time, excluding clinical staff time, reviewing notes and preparing to see patient, ordering tests and/or medications, and counseling the patient.    Assessment:    1. Encounter for gynecological examination with Papanicolaou smear of cervix (Primary) Rx: - Cytology - PAP( [place])  2. Missed periods Rx: - POCT urine pregnancy - hCG, serum, qualitative  3. Nexplanon  in place ( expired ) - will schedule appointment for removal  4. Vaginal discharge Rx: - Cervicovaginal ancillary only( ) - HIV Antibody (routine testing w rflx) - Hepatitis B surface antigen - RPR - Hepatitis C antibody     Plan:    Education reviewed: calcium supplements, depression evaluation, low fat, low cholesterol diet, safe sex/STD prevention, self breast exams, and weight bearing exercise. Follow up in: 2 weeks.    Orders  Placed This Encounter  Procedures   hCG, serum, qualitative   HIV Antibody (routine testing w rflx)   Hepatitis B surface antigen   RPR   Hepatitis C antibody   POCT urine pregnancy    CARLIN RONAL CENTERS, MD, FACOG Attending Obstetrician & Gynecologist, [facility] for [facility], [facility], [place] 01/30/2024

## 2024-01-30 NOTE — Progress Notes (Unsigned)
 Pt presents for pregnancy test. No period since 11/30/2023. Pt has had 3 neg. Test at home and would like a blood test today.

## 2024-01-31 LAB — CERVICOVAGINAL ANCILLARY ONLY
Bacterial Vaginitis (gardnerella): POSITIVE — AB
Candida Glabrata: NEGATIVE
Candida Vaginitis: NEGATIVE
Chlamydia: NEGATIVE
Comment: NEGATIVE
Comment: NEGATIVE
Comment: NEGATIVE
Comment: NEGATIVE
Comment: NEGATIVE
Comment: NORMAL
Neisseria Gonorrhea: NEGATIVE
Trichomonas: POSITIVE — AB

## 2024-01-31 LAB — RPR: RPR Ser Ql: NONREACTIVE

## 2024-01-31 LAB — HEPATITIS B SURFACE ANTIGEN: Hepatitis B Surface Ag: NEGATIVE

## 2024-01-31 LAB — HEPATITIS C ANTIBODY: Hep C Virus Ab: NONREACTIVE

## 2024-01-31 LAB — HIV ANTIBODY (ROUTINE TESTING W REFLEX): HIV Screen 4th Generation wRfx: NONREACTIVE

## 2024-01-31 LAB — HCG, SERUM, QUALITATIVE: hCG,Beta Subunit,Qual,Serum: NEGATIVE m[IU]/mL (ref <6)

## 2024-02-01 ENCOUNTER — Ambulatory Visit: Payer: Self-pay | Admitting: Obstetrics

## 2024-02-01 DIAGNOSIS — B9689 Other specified bacterial agents as the cause of diseases classified elsewhere: Secondary | ICD-10-CM

## 2024-02-01 DIAGNOSIS — A599 Trichomoniasis, unspecified: Secondary | ICD-10-CM

## 2024-02-01 MED ORDER — METRONIDAZOLE 500 MG PO TABS: 500.0000 mg | ORAL_TABLET | Freq: Two times a day (BID) | ORAL | 2 refills | Status: DC

## 2024-02-03 LAB — CYTOLOGY - PAP
Comment: NEGATIVE
Diagnosis: NEGATIVE
High risk HPV: NEGATIVE

## 2024-02-27 ENCOUNTER — Ambulatory Visit: Admitting: Obstetrics and Gynecology

## 2024-02-27 ENCOUNTER — Encounter: Payer: Self-pay | Admitting: Obstetrics and Gynecology

## 2024-02-27 VITALS — BP 100/68 | HR 69 | Ht 67.0 in | Wt 157.2 lb

## 2024-02-27 DIAGNOSIS — Z3046 Encounter for surveillance of implantable subdermal contraceptive: Secondary | ICD-10-CM

## 2024-02-27 NOTE — Progress Notes (Signed)
 Patient ID: Julia Russell, female   DOB: 1990-04-20, 33 y.o.   MRN: [id number] Ms. Julia Russell is a [id number] female in the office today for removal of Nexplanon ; which was inserted 10/24/2016 at [facility]. She does not desire to have it replaced today. She has no complaints today.  BP 100/68   Pulse 69   Ht 5' 7 (1.702 m)   Wt 157 lb 3.2 oz (71.3 kg)   LMP 11/30/2023 (Exact Date)   BMI 24.62 kg/m    Procedure Note: Consent obtained and Time-Out conducted Implant palpated in left upper arm Betadine prep done on area of excision/removal Lidocaine  infiltrated into intradermal and subcutaneous space Small 2mm incision made with scalpel Pressure applied to distal end of implant which exposed the tip through incision Tip of implant grasped with hemostat There was some adherence of implant in subcutaneous tissue.  Twisting and manipulation freed the implant from the capsule Implant removed Pressure held on incision until bleeding stopped Steristrips applied to incision Pressure dressing applied by RN Patient tolerated procedure well.   Assessment and Plan: Encounter for Nexplanon  removal    Total face-to-face time spent during this encounter was 10 minutes. There was 5 minutes of chart review time spent prior to this encounter. Total time spent = 15 minutes.   Ala Cart, CNM  02/27/2024 2:59 PM

## 2024-02-27 NOTE — Progress Notes (Unsigned)
 Pt wishes to have her nexplanon  out so she can conceive.   In left arm.

## 2024-04-07 ENCOUNTER — Ambulatory Visit: Payer: Self-pay | Admitting: Obstetrics

## 2024-04-21 ENCOUNTER — Other Ambulatory Visit: Payer: Self-pay | Admitting: Obstetrics and Gynecology

## 2024-04-21 ENCOUNTER — Ambulatory Visit: Admitting: Obstetrics and Gynecology

## 2024-04-21 ENCOUNTER — Encounter: Payer: Self-pay | Admitting: Obstetrics and Gynecology

## 2024-04-21 VITALS — BP 106/70 | HR 79 | Ht 67.0 in | Wt 162.0 lb

## 2024-04-21 DIAGNOSIS — N644 Mastodynia: Secondary | ICD-10-CM

## 2024-04-21 DIAGNOSIS — N63 Unspecified lump in unspecified breast: Secondary | ICD-10-CM | POA: Diagnosis not present

## 2024-04-21 NOTE — Progress Notes (Signed)
" °  CC: right breast pain Subjective:    Patient ID: Julia Russell, female    DOB: [birth year], 34 y.o.   MRN: [id number]  HPI  34 yo G5P5 seen for 1 year hx of right breast pain and possible lumps.  Pt denies tobacco us .  Pt notes grandmother and great aunt have been diagnosed with breast CA later in life.  Pt denies excessive caffeine use.  Review of Systems     Objective:   Physical Exam Vitals:   04/21/24 1442  BP: 106/70  Pulse: 79   Breast exam.  No obvious palpable masses bilaterally. Breasts  bilaterally with lumpy texture. Concentration of lumpy tissue at 10 o'clock right breast Right breast mildly tender around areola      Assessment & Plan:   1. Painful lumpy breasts (Primary) Possible fibrocystic breasts Will order right diagnostic mammogram - MM Digital Diagnostic Bilat; Future    Jerilynn DELENA Buddle, MD Faculty Attending, Center for [facility]  "

## 2024-04-21 NOTE — Progress Notes (Addendum)
 34 y.o. GYN presents for R breast lumps 2-10/10 x 1 year.  Last PAP 01/30/24

## 2024-04-29 ENCOUNTER — Other Ambulatory Visit

## 2024-04-29 ENCOUNTER — Encounter
